# Patient Record
Sex: Male | Born: 1955 | Race: White | Hispanic: No | Smoking: Current every day smoker
Health system: Southern US, Community
[De-identification: ages and names within clinical notes are randomized; demographics above are authoritative.]

## PROBLEM LIST (undated history)

## (undated) DIAGNOSIS — F17200 Nicotine dependence, unspecified, uncomplicated: Secondary | ICD-10-CM

---

## 2014-04-30 ENCOUNTER — Emergency Department
Admission: EM | Admit: 2014-04-30 | Discharge: 2014-04-30 | Disposition: A | Discharge status: Home / Self Care | Payer: Self-pay | Source: Home / Self Care

## 2014-04-30 ENCOUNTER — Encounter: Payer: Self-pay | Admitting: Emergency Medicine

## 2014-04-30 DIAGNOSIS — K089 Disorder of teeth and supporting structures, unspecified: Secondary | ICD-10-CM

## 2014-04-30 DIAGNOSIS — K0889 Other specified disorders of teeth and supporting structures: Secondary | ICD-10-CM

## 2014-04-30 HISTORY — DX: Nicotine dependence, unspecified, uncomplicated: F17.200

## 2014-04-30 MED ORDER — ERYTHROMYCIN BASE 500 MG PO TBEC: 500.0000 mg | DELAYED_RELEASE_TABLET | Freq: Four times a day (QID) | ORAL | Status: DC

## 2014-04-30 NOTE — ED Notes (Addendum)
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 Has been taking ibuprofen 800mg  q 6 hours.

## 2014-04-30 NOTE — ED Provider Notes (Signed)
CSN: [id number]     Arrival date & time 04/30/14  1814 History   None    Chief Complaint  Patient presents with  . Dental Pain   (Consider location/radiation/quality/duration/timing/severity/associated sxs/prior Treatment) Patient is a [age] y.o. male presenting with tooth pain. The history is provided by the patient. No language interpreter was used.  Dental Pain Location:  Lower Lower teeth location:  19/LL 1st molar Quality:  Aching Severity:  Moderate Onset quality:  Gradual Timing:  Constant Progression:  Worsening Chronicity:  New Context: poor dentition   Relieved by:  Nothing Ineffective treatments:  NSAIDs Risk factors: alcohol problem     Past Medical History  Diagnosis Date  . Heavy smoker    History reviewed. No pertinent past surgical history. Family History  Problem Relation Age of Onset  . Hypertension Brother   . Diabetes Maternal Aunt    History  Substance Use Topics  . Smoking status: Current Every Day Smoker  . Smokeless tobacco: Not on file  . Alcohol Use: No    Review of Systems  HENT: Positive for dental problem.   All other systems reviewed and are negative.   Allergies  Review of patient's allergies indicates no known allergies.  Home Medications   Prior to Admission medications   Medication Sig Start Date End Date Taking? Authorizing Provider  erythromycin (ERY-TAB) 500 MG EC tablet Take 1 tablet (500 mg total) by mouth 4 (four) times daily. 04/30/14   Elson AreasLeslie K Roni Friberg, PA-C   BP 161/77 mmHg  Pulse 69  Temp(Src) 98.2 F (36.8 C) (Oral)  Resp 16  Ht 5' 6.5" (1.689 m)  Wt 160 lb (72.576 kg)  BMI 25.44 kg/m2  SpO2 98% Physical Exam  Constitutional: He is oriented to person, place, and time. He appears well-developed and well-nourished.  HENT:  Head: Normocephalic.  Poor dentition, severe decay,   Broken tooth left lower   Eyes: EOM are normal. Pupils are equal, round, and reactive to light.  Neck: Normal range of motion.   Cardiovascular: Normal rate.   Pulmonary/Chest: Effort normal.  Abdominal: He exhibits no distension.  Musculoskeletal: Normal range of motion.  Neurological: He is alert and oriented to person, place, and time.  Psychiatric: He has a normal mood and affect.  Nursing note and vitals reviewed.   ED Course  Procedures (including critical care time) Labs Review Labs Reviewed - No data to display  Imaging Review No results found.  Meds ordered this encounter  Medications  . erythromycin (ERY-TAB) 500 MG EC tablet    Sig: Take 1 tablet (500 mg total) by mouth 4 (four) times daily.    Dispense:  40 tablet    Refill:  0    Order Specific Question:  Supervising Provider    Answer:  MASSEY, DAVID [[id number]]   MDM   1. Tooth ache    Meds ordered this encounter  Medications  . erythromycin (ERY-TAB) 500 MG EC tablet    Sig: Take 1 tablet (500 mg total) by mouth 4 (four) times daily.    Dispense:  40 tablet    Refill:  0    Order Specific Question:  Supervising Provider    Answer:  Georgina PillionMASSEY, DAVID [[id number]]  AVS Referral to Dr Lucky CowboyKnox and Dr. Nance Pewivils    Alie Moudy K Jacquita Mulhearn, PA-C 04/30/14 1929

## 2014-04-30 NOTE — Discharge Instructions (Signed)

## 2018-04-10 ENCOUNTER — Emergency Department (HOSPITAL_COMMUNITY): Payer: PRIVATE HEALTH INSURANCE

## 2018-04-10 ENCOUNTER — Inpatient Hospital Stay (HOSPITAL_COMMUNITY)
Admission: EM | Admit: 2018-04-10 | Discharge: 2018-04-14 | DRG: 190 | Disposition: A | Discharge status: Home / Self Care | Payer: PRIVATE HEALTH INSURANCE | Attending: Internal Medicine | Admitting: Internal Medicine

## 2018-04-10 ENCOUNTER — Encounter (HOSPITAL_COMMUNITY): Payer: Self-pay | Admitting: Emergency Medicine

## 2018-04-10 ENCOUNTER — Other Ambulatory Visit: Payer: Self-pay

## 2018-04-10 DIAGNOSIS — F17200 Nicotine dependence, unspecified, uncomplicated: Secondary | ICD-10-CM | POA: Diagnosis present

## 2018-04-10 DIAGNOSIS — J9601 Acute respiratory failure with hypoxia: Secondary | ICD-10-CM | POA: Diagnosis not present

## 2018-04-10 DIAGNOSIS — R0602 Shortness of breath: Secondary | ICD-10-CM | POA: Diagnosis not present

## 2018-04-10 DIAGNOSIS — A419 Sepsis, unspecified organism: Secondary | ICD-10-CM | POA: Diagnosis not present

## 2018-04-10 DIAGNOSIS — E860 Dehydration: Secondary | ICD-10-CM | POA: Diagnosis present

## 2018-04-10 DIAGNOSIS — T380X5A Adverse effect of glucocorticoids and synthetic analogues, initial encounter: Secondary | ICD-10-CM | POA: Diagnosis present

## 2018-04-10 DIAGNOSIS — N179 Acute kidney failure, unspecified: Secondary | ICD-10-CM | POA: Diagnosis not present

## 2018-04-10 DIAGNOSIS — Z720 Tobacco use: Secondary | ICD-10-CM | POA: Diagnosis present

## 2018-04-10 DIAGNOSIS — D72829 Elevated white blood cell count, unspecified: Secondary | ICD-10-CM | POA: Diagnosis present

## 2018-04-10 DIAGNOSIS — J9602 Acute respiratory failure with hypercapnia: Secondary | ICD-10-CM | POA: Diagnosis not present

## 2018-04-10 DIAGNOSIS — E872 Acidosis: Secondary | ICD-10-CM | POA: Diagnosis present

## 2018-04-10 DIAGNOSIS — R739 Hyperglycemia, unspecified: Secondary | ICD-10-CM | POA: Diagnosis present

## 2018-04-10 DIAGNOSIS — J441 Chronic obstructive pulmonary disease with (acute) exacerbation: Secondary | ICD-10-CM | POA: Diagnosis present

## 2018-04-10 DIAGNOSIS — F419 Anxiety disorder, unspecified: Secondary | ICD-10-CM | POA: Diagnosis present

## 2018-04-10 DIAGNOSIS — R6510 Systemic inflammatory response syndrome (SIRS) of non-infectious origin without acute organ dysfunction: Secondary | ICD-10-CM | POA: Diagnosis present

## 2018-04-10 LAB — CBC WITH DIFFERENTIAL/PLATELET
Abs Immature Granulocytes: 0.1 10*3/uL — ABNORMAL HIGH (ref 0.00–0.07)
Basophils Absolute: 0 10*3/uL (ref 0.0–0.1)
Basophils Relative: 0 %
EOS ABS: 0 10*3/uL (ref 0.0–0.5)
EOS PCT: 0 %
HEMATOCRIT: 49.9 % (ref 39.0–52.0)
HEMOGLOBIN: 15.6 g/dL (ref 13.0–17.0)
Immature Granulocytes: 1 %
LYMPHS PCT: 10 %
Lymphs Abs: 1.5 10*3/uL (ref 0.7–4.0)
MCH: 28.5 pg (ref 26.0–34.0)
MCHC: 31.3 g/dL (ref 30.0–36.0)
MCV: 91.1 fL (ref 80.0–100.0)
MONOS PCT: 11 %
Monocytes Absolute: 1.6 10*3/uL — ABNORMAL HIGH (ref 0.1–1.0)
NRBC: 0 % (ref 0.0–0.2)
Neutro Abs: 11.6 10*3/uL — ABNORMAL HIGH (ref 1.7–7.7)
Neutrophils Relative %: 78 %
Platelets: 235 10*3/uL (ref 150–400)
RBC: 5.48 MIL/uL (ref 4.22–5.81)
RDW: 13 % (ref 11.5–15.5)
WBC: 14.8 10*3/uL — ABNORMAL HIGH (ref 4.0–10.5)

## 2018-04-10 LAB — I-STAT VENOUS BLOOD GAS, ED
Acid-Base Excess: 6 mmol/L — ABNORMAL HIGH (ref 0.0–2.0)
BICARBONATE: 33.6 mmol/L — AB (ref 20.0–28.0)
O2 Saturation: 97 %
PCO2 VEN: 60.4 mmHg — AB (ref 44.0–60.0)
PH VEN: 7.353 (ref 7.250–7.430)
TCO2: 35 mmol/L — ABNORMAL HIGH (ref 22–32)
pO2, Ven: 97 mmHg — ABNORMAL HIGH (ref 32.0–45.0)

## 2018-04-10 LAB — I-STAT CG4 LACTIC ACID, ED
Lactic Acid, Venous: 3.25 mmol/L (ref 0.5–1.9)
Lactic Acid, Venous: 5.37 mmol/L (ref 0.5–1.9)

## 2018-04-10 LAB — COMPREHENSIVE METABOLIC PANEL
ALT: 21 U/L (ref 0–44)
AST: 39 U/L (ref 15–41)
Albumin: 3.9 g/dL (ref 3.5–5.0)
Alkaline Phosphatase: 45 U/L (ref 38–126)
Anion gap: 12 (ref 5–15)
BUN: 18 mg/dL (ref 6–20)
CO2: 27 mmol/L (ref 22–32)
CREATININE: 1.29 mg/dL — AB (ref 0.61–1.24)
Calcium: 9 mg/dL (ref 8.9–10.3)
Chloride: 95 mmol/L — ABNORMAL LOW (ref 98–111)
GFR calc non Af Amer: 60 mL/min — ABNORMAL LOW (ref >60)
Glucose, Bld: 150 mg/dL — ABNORMAL HIGH (ref 70–99)
Potassium: 4.2 mmol/L (ref 3.5–5.1)
SODIUM: 134 mmol/L — AB (ref 135–145)
Total Bilirubin: 0.7 mg/dL (ref 0.3–1.2)
Total Protein: 7.4 g/dL (ref 6.5–8.1)

## 2018-04-10 LAB — I-STAT TROPONIN, ED: Troponin i, poc: 0 ng/mL (ref 0.00–0.08)

## 2018-04-10 LAB — PROCALCITONIN: PROCALCITONIN: 0.17 ng/mL

## 2018-04-10 LAB — D-DIMER, QUANTITATIVE (NOT AT ARMC): D DIMER QUANT: 0.82 ug{FEU}/mL — AB (ref 0.00–0.50)

## 2018-04-10 MED ORDER — LORAZEPAM 2 MG/ML IJ SOLN
0.5000 mg | Freq: Once | INTRAMUSCULAR | Status: AC
  Administered 2018-04-10: 0.5 mg via INTRAVENOUS
  Filled 2018-04-10: qty 1

## 2018-04-10 MED ORDER — AZITHROMYCIN 250 MG PO TABS
500.0000 mg | ORAL_TABLET | Freq: Every day | ORAL | Status: AC
  Administered 2018-04-10: 500 mg via ORAL
  Filled 2018-04-10: qty 2

## 2018-04-10 MED ORDER — SODIUM CHLORIDE 0.9 % IV SOLN
INTRAVENOUS | Status: DC
  Administered 2018-04-10: 23:00:00 via INTRAVENOUS

## 2018-04-10 MED ORDER — ACETAMINOPHEN 325 MG PO TABS: 650.0000 mg | ORAL_TABLET | Freq: Four times a day (QID) | ORAL | Status: DC | PRN

## 2018-04-10 MED ORDER — ONDANSETRON HCL 4 MG/2ML IJ SOLN: 4.0000 mg | Freq: Three times a day (TID) | INTRAMUSCULAR | Status: DC | PRN

## 2018-04-10 MED ORDER — ALBUTEROL (5 MG/ML) CONTINUOUS INHALATION SOLN
15.0000 mg | INHALATION_SOLUTION | RESPIRATORY_TRACT | Status: DC
  Administered 2018-04-10: 15 mg via RESPIRATORY_TRACT
  Filled 2018-04-10: qty 20

## 2018-04-10 MED ORDER — SODIUM CHLORIDE 0.9 % IV BOLUS
1500.0000 mL | Freq: Once | INTRAVENOUS | Status: AC
  Administered 2018-04-10: 1500 mL via INTRAVENOUS

## 2018-04-10 MED ORDER — AZITHROMYCIN 250 MG PO TABS
250.0000 mg | ORAL_TABLET | Freq: Every day | ORAL | Status: AC
  Administered 2018-04-11 – 2018-04-14 (×4): 250 mg via ORAL
  Filled 2018-04-10 (×4): qty 1

## 2018-04-10 MED ORDER — LEVALBUTEROL HCL 1.25 MG/0.5ML IN NEBU
1.2500 mg | INHALATION_SOLUTION | Freq: Three times a day (TID) | RESPIRATORY_TRACT | Status: DC
  Administered 2018-04-11 – 2018-04-14 (×10): 1.25 mg via RESPIRATORY_TRACT
  Filled 2018-04-10 (×10): qty 0.5

## 2018-04-10 MED ORDER — IPRATROPIUM BROMIDE 0.02 % IN SOLN
0.5000 mg | Freq: Three times a day (TID) | RESPIRATORY_TRACT | Status: DC
  Administered 2018-04-11 – 2018-04-14 (×10): 0.5 mg via RESPIRATORY_TRACT
  Filled 2018-04-10 (×10): qty 2.5

## 2018-04-10 MED ORDER — IPRATROPIUM BROMIDE 0.02 % IN SOLN
0.5000 mg | RESPIRATORY_TRACT | Status: DC
  Administered 2018-04-10: 0.5 mg via RESPIRATORY_TRACT
  Filled 2018-04-10: qty 2.5

## 2018-04-10 MED ORDER — DM-GUAIFENESIN ER 30-600 MG PO TB12
1.0000 | ORAL_TABLET | Freq: Two times a day (BID) | ORAL | Status: DC | PRN
Start: 2018-04-10 — End: 2018-04-13

## 2018-04-10 MED ORDER — NICOTINE 21 MG/24HR TD PT24
21.0000 mg | MEDICATED_PATCH | Freq: Every day | TRANSDERMAL | Status: DC
  Filled 2018-04-10 (×3): qty 1

## 2018-04-10 MED ORDER — METHYLPREDNISOLONE SODIUM SUCC 125 MG IJ SOLR
60.0000 mg | Freq: Three times a day (TID) | INTRAMUSCULAR | Status: DC
  Administered 2018-04-10 – 2018-04-12 (×5): 60 mg via INTRAVENOUS
  Filled 2018-04-10 (×5): qty 2

## 2018-04-10 MED ORDER — ZOLPIDEM TARTRATE 5 MG PO TABS: 5.0000 mg | ORAL_TABLET | Freq: Every evening | ORAL | Status: DC | PRN

## 2018-04-10 MED ORDER — SODIUM CHLORIDE 0.9 % IV BOLUS
1000.0000 mL | Freq: Once | INTRAVENOUS | Status: AC
  Administered 2018-04-10: 1000 mL via INTRAVENOUS

## 2018-04-10 MED ORDER — LEVALBUTEROL HCL 1.25 MG/0.5ML IN NEBU
1.2500 mg | INHALATION_SOLUTION | Freq: Four times a day (QID) | RESPIRATORY_TRACT | Status: DC
  Administered 2018-04-10: 1.25 mg via RESPIRATORY_TRACT
  Filled 2018-04-10: qty 0.5

## 2018-04-10 MED ORDER — ALBUTEROL (5 MG/ML) CONTINUOUS INHALATION SOLN
15.0000 mg/h | INHALATION_SOLUTION | RESPIRATORY_TRACT | Status: AC
  Administered 2018-04-10: 15 mg/h via RESPIRATORY_TRACT

## 2018-04-10 MED ORDER — ENOXAPARIN SODIUM 40 MG/0.4ML ~~LOC~~ SOLN
40.0000 mg | SUBCUTANEOUS | Status: DC
  Administered 2018-04-10 – 2018-04-13 (×4): 40 mg via SUBCUTANEOUS
  Filled 2018-04-10 (×4): qty 0.4

## 2018-04-10 NOTE — ED Notes (Signed)
I [place] ACid result of 3.25 reported to Dr. Clarene Duke
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
## 2018-04-10 NOTE — ED Notes (Signed)
I [place] Acid result of 5.37 reported to Glory Rosebush RN

## 2018-04-10 NOTE — H&P (Addendum)
History and Physical    Geoffrey RosenthalJeffrey Leonard QMV:[id number] DOB: Oct 25, 1955 DOA: 04/10/2018  Referring MD/NP/PA:   PCP: Patient, No Pcp Per   Patient coming from:  The patient is coming from home.  At baseline, pt is independent for most of ADL.        Chief Complaint: Shortness of breath and cough  HPI: Geoffrey RosenthalJeffrey Leonard is a 63 y.o. male with medical history significant of tobacco abuse, who presents with shortness of breath and cough.  Patient states that he has been having cough and shortness of breath for more than 3 days after exposed to disel type of chemical.  He has productive cough with brownish colored sputum production.  Denies any chest pain.  He can barely speak in full sentences in the ED.  Oxygen saturation 87% on room air, requiring 5 to 6 L mask oxygen.  He has chills, but no fever.  No nausea vomiting, diarrhea, abdominal pain, symptoms of UTI or unilateral weakness.  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  Denies tenderness in the calf areas.  Patient states that he has heavily smoked for more than 20 years, but stopped smoking 3 days ago.  ED Course: pt was found to have negative troponin, lactic acid 3.35, 5.37, AKI with creatinine 1.29, BUN 18, temperature 99.2, tachycardia, tachypnea.  Repeat ABG pH 7.35, CO2 60, O2 97. Chest x-ray negative for infiltration.  Patient is admitted to stepdown bed as inpatient.  Review of Systems:   General: no fevers, has chills, no body weight gain, has fatigue HEENT: no blurry vision, hearing changes or sore throat Respiratory: Has dyspnea, coughing, no wheezing CV: no chest pain, no palpitations GI: no nausea, vomiting, abdominal pain, diarrhea, constipation. GU: no dysuria, burning on urination, increased urinary frequency, hematuria  Ext: no leg edema Neuro: no unilateral weakness, numbness, or tingling, no vision change or hearing loss Skin: no rash, no skin tear. MSK: No muscle spasm, no deformity,  no limitation of range of movement in spin Heme: No easy bruising.  Travel history: Has a frequent long distant travel.  Allergy: No Known Allergies  Past Medical History:  Diagnosis Date  . Heavy smoker     History reviewed. No pertinent surgical history.  Social History:  reports that he has been smoking. He does not have any smokeless tobacco history on file. He reports that he does not drink alcohol. No history on file for drug.  Family History:  Family History  Problem Relation Age of Onset  . Hypertension Brother   . Diabetes Maternal Aunt      Prior to Admission medications   Medication Sig Start Date End Date Taking? Authorizing Provider  erythromycin (ERY-TAB) 500 MG EC tablet Take 1 tablet (500 mg total) by mouth 4 (four) times daily. 04/30/14   Elson AreasSofia, Leslie K, PA-C    Physical Exam: Vitals:   04/10/18 1803 04/10/18 1806 04/10/18 1833 04/10/18 1930  BP:  (!) 134/100 (!) 152/93 118/89  Pulse: (!) 128 (!) 129 (!) 133 (!) 126  Resp: (!) 21 (!) 22 (!) 25 (!) 24  Temp:      TempSrc:      SpO2: 96% 96% 91% 91%   General:  in acute respiratory distress HEENT:       Eyes: PERRL, EOMI, no scleral icterus.       ENT: No discharge from the ears and nose, no pharynx injection, no tonsillar enlargement.  Neck: No JVD, no bruit, no mass felt. Heme: No neck lymph node enlargement. Cardiac: S1/S2, RRR, No murmurs, No gallops or rubs. Respiratory: Severely decreased air movement bilaterally. GI: Soft, nondistended, nontender, no rebound pain, no organomegaly, BS present. GU: No hematuria Ext: No pitting leg edema bilaterally. 2+DP/PT pulse bilaterally. Musculoskeletal: No joint deformities, No joint redness or warmth, no limitation of ROM in spin. Skin: No rashes.  Neuro: Alert, oriented X3, cranial nerves II-XII grossly intact, moves all extremities normally.  Psych: Patient is not psychotic, no suicidal or hemocidal ideation.  Labs on Admission: I have personally  reviewed following labs and imaging studies  CBC: Recent Labs  Lab 04/10/18 1557  WBC 14.8*  NEUTROABS 11.6*  HGB 15.6  HCT 49.9  MCV 91.1  PLT 235   Basic Metabolic Panel: Recent Labs  Lab 04/10/18 1557  NA 134*  K 4.2  CL 95*  CO2 27  GLUCOSE 150*  BUN 18  CREATININE 1.29*  CALCIUM 9.0   GFR: CrCl cannot be calculated (Unknown ideal weight.). Liver Function Tests: Recent Labs  Lab 04/10/18 1557  AST 39  ALT 21  ALKPHOS 45  BILITOT 0.7  PROT 7.4  ALBUMIN 3.9   No results for input(s): LIPASE, AMYLASE in the last 168 hours. No results for input(s): AMMONIA in the last 168 hours. Coagulation Profile: No results for input(s): INR, PROTIME in the last 168 hours. Cardiac Enzymes: No results for input(s): CKTOTAL, CKMB, CKMBINDEX, TROPONINI in the last 168 hours. BNP (last 3 results) No results for input(s): PROBNP in the last 8760 hours. HbA1C: No results for input(s): HGBA1C in the last 72 hours. CBG: No results for input(s): GLUCAP in the last 168 hours. Lipid Profile: No results for input(s): CHOL, HDL, LDLCALC, TRIG, CHOLHDL, LDLDIRECT in the last 72 hours. Thyroid Function Tests: No results for input(s): TSH, T4TOTAL, FREET4, T3FREE, THYROIDAB in the last 72 hours. Anemia Panel: No results for input(s): VITAMINB12, FOLATE, FERRITIN, TIBC, IRON, RETICCTPCT in the last 72 hours. Urine analysis: No results found for: COLORURINE, APPEARANCEUR, LABSPEC, PHURINE, GLUCOSEU, HGBUR, BILIRUBINUR, KETONESUR, PROTEINUR, UROBILINOGEN, NITRITE, LEUKOCYTESUR Sepsis Labs: @LABRCNTIP (procalcitonin:4,lacticidven:4) )No results found for this or any previous visit (from the past 240 hour(s)).   Radiological Exams on Admission: Dg Chest Port 1 View  Result Date: 04/10/2018 CLINICAL DATA:  Shortness of breath. EXAM: PORTABLE CHEST 1 VIEW COMPARISON:  None. FINDINGS: The right costophrenic angle is excluded from the field of view. The heart size and mediastinal contours  are within normal limits. Normal pulmonary vascularity. Mild diffusely coarsened interstitial markings are likely smoking-related. No consolidation, pneumothorax, or large pleural effusion. No acute osseous abnormality. IMPRESSION: No active disease. Electronically Signed   By: Obie DredgeWilliam T Derry M.D.   On: 04/10/2018 18:01     EKG: Independently reviewed.  Sinus rhythm, tachycardia, QTC 442, low voltage, anteroseptal infarction pattern,  Assessment/Plan Principal Problem:   COPD exacerbation (HCC) Active Problems:   Acute respiratory failure with hypoxia and hypercapnia (HCC)   AKI (acute kidney injury) (HCC)   Tobacco abuse   Sepsis (HCC)   Acute respiratory failure with hypoxia and hypercapnia and sepsis due to COPD exacerbation: Patient has heavily smoked for more than 20 years, now has productive cough, shortness of breath, severely decreased air movement on auscultation, no infiltration on chest x-ray, clinically consistent with COPD exacerbation.  Patient may have undiagnosed COPD.  Will need outpatient PFT test.  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  Patient meets criteria for sepsis with leukocytosis, tachycardia and tachypnea.  Lactic acid is elevated up to 5.37.  -will admit patient to SDU as inpt -Nebulizers: scheduled Atrovent and prn Xopenex Nebs -Solu-Medrol 60 mg IV tid -Z pak -Mucinex for cough  -Incentive spirometry -Follow up blood culture x2, sputum culture, Flu pcr -Mask oxygen as needed to maintain O2 saturation 92% or greater. If deteriorates, will start BiPAP. -will get Procalcitonin and trend lactic acid levels per sepsis protocol. -IVF: 2.5L of NS bolus in ED, followed by 125 cc/h-->will give more NS bolus depending on lactic acid level. -check D-dimer. If positive, will get CTA to r/o PE and LE doppler to r/o DVT   AKI: Likely due to dehydration. - IVF as above - Follow up renal function by BMP -Avoid using  renal toxic medications, such as NSAIDs  Tobacco abuse: Patient states that he has heavily smoked for more than 20 years, but stopped smoking 3 days ago.  -Did counseling about importance of quitting smoking.  Encouraged the patient not to restart smoking. -Nicotine patch  Inpatient status:  # Patient requires inpatient status due to high intensity of service, high risk for further deterioration and high frequency of surveillance required.  I certify that at the point of admission it is my clinical judgment that the patient will require inpatient hospital care spanning beyond 2 midnights from the point of admission.  . This patient has long history of heavy smoking, may have undiagnosed COPD . The worrisome physical exam findings include severely diminished air movement bilaterally on auscultation. . The initial radiographic and laboratory data are worrisome because of AKI, sepsis, leukocytosis, CO2 retention . Current medical needs: please see my assessment and plan . Predictability of an adverse outcome (risk): Patient has severe COPD exacerbation.  Oxygen desaturation and new oxygen requirement. Also has sepsis. Lung auscultation showed severely diminished air movement bilaterally.  Patient is at high risk of deteriorating.  Will need to stay in hospital for at least 2 days.    DVT ppx: SQ Lovenox Code Status: Full code Family Communication: None at bed side.     Disposition Plan:  Anticipate discharge back to previous home environment Consults called:  none Admission status:   SDU/inpation       Date of Service 04/10/2018    Lorretta Harp [facility] Pager [phone]  If 7PM-7AM, please contact night-coverage [email or web address] Password [facility] 04/10/2018, 8:46 PM
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
## 2018-04-11 ENCOUNTER — Inpatient Hospital Stay (HOSPITAL_COMMUNITY): Payer: PRIVATE HEALTH INSURANCE

## 2018-04-11 ENCOUNTER — Encounter (HOSPITAL_COMMUNITY): Payer: Self-pay

## 2018-04-11 DIAGNOSIS — R7989 Other specified abnormal findings of blood chemistry: Secondary | ICD-10-CM

## 2018-04-11 DIAGNOSIS — J441 Chronic obstructive pulmonary disease with (acute) exacerbation: Principal | ICD-10-CM

## 2018-04-11 LAB — LACTIC ACID, PLASMA
LACTIC ACID, VENOUS: 6.9 mmol/L — AB (ref 0.5–1.9)
Lactic Acid, Venous: 7.2 mmol/L (ref 0.5–1.9)
Lactic Acid, Venous: 4.1 mmol/L (ref 0.5–1.9)
Lactic Acid, Venous: 1.7 mmol/L (ref 0.5–1.9)

## 2018-04-11 LAB — HIV ANTIBODY (ROUTINE TESTING W REFLEX): HIV Screen 4th Generation wRfx: NONREACTIVE

## 2018-04-11 LAB — MRSA PCR SCREENING: MRSA by PCR: NEGATIVE

## 2018-04-11 MED ORDER — ENSURE ENLIVE PO LIQD
237.0000 mL | Freq: Two times a day (BID) | ORAL | Status: DC
  Administered 2018-04-11 – 2018-04-13 (×3): 237 mL via ORAL

## 2018-04-11 MED ORDER — IOPAMIDOL (ISOVUE-370) INJECTION 76%
INTRAVENOUS | Status: AC
  Filled 2018-04-11: qty 100

## 2018-04-11 MED ORDER — SODIUM CHLORIDE 0.9 % IV BOLUS
1000.0000 mL | Freq: Once | INTRAVENOUS | Status: AC
  Administered 2018-04-11: 1000 mL via INTRAVENOUS

## 2018-04-11 MED ORDER — PANTOPRAZOLE SODIUM 40 MG PO TBEC
40.0000 mg | DELAYED_RELEASE_TABLET | Freq: Every day | ORAL | Status: DC
  Administered 2018-04-11 – 2018-04-12 (×3): 40 mg via ORAL
  Filled 2018-04-11 (×3): qty 1

## 2018-04-11 MED ORDER — IOPAMIDOL (ISOVUE-370) INJECTION 76%
100.0000 mL | Freq: Once | INTRAVENOUS | Status: AC | PRN
  Administered 2018-04-11: 75 mL via INTRAVENOUS

## 2018-04-11 MED ORDER — ALPRAZOLAM 0.5 MG PO TABS
0.5000 mg | ORAL_TABLET | Freq: Three times a day (TID) | ORAL | Status: DC | PRN
  Administered 2018-04-11 – 2018-04-13 (×4): 0.5 mg via ORAL
  Filled 2018-04-11 (×4): qty 1

## 2018-04-11 MED ORDER — ALUM & MAG HYDROXIDE-SIMETH 200-200-20 MG/5ML PO SUSP
30.0000 mL | Freq: Four times a day (QID) | ORAL | Status: DC | PRN
  Administered 2018-04-11: 30 mL via ORAL
  Filled 2018-04-11: qty 30

## 2018-04-11 NOTE — Progress Notes (Signed)
Bilateral lower extremity duplex has been completed.   Preliminary results in CV Proc.   Blanch Media 04/11/2018 9:26 AM

## 2018-04-11 NOTE — Progress Notes (Addendum)
CRITICAL VALUE ALERT  Critical Value: Lactic Acid 7.2  Date & Time Notied:  04/11/2018 & 00:07  Provider Notified: Dr. Clyde Lundborg  Orders Received/Actions taken: 1 Liter NS bolus and increase continuous fluids to 150 ml/hr

## 2018-04-11 NOTE — Progress Notes (Signed)
Nutrition Consult/Brief Note  RD consulted via Inpatient COPD Exacerbation Protocol.  Wt Readings from Last 15 Encounters:  04/10/18 70.6 kg  04/30/14 72.6 kg   Body mass index is 24.38 kg/m. Patient meets criteria for Normal based on current BMI.   Current diet order is Heart Healthy, patient is consuming approximately 100% of meals at this time.   Labs and medications reviewed. No nutrition interventions warranted at this time.   If nutrition issues arise, please consult RD.   Maureen Chatters, RD, LDN Pager #: [phone] After-Hours Pager #: [phone]

## 2018-04-11 NOTE — Progress Notes (Signed)
CRITICAL VALUE ALERT  Critical Value:  Lactic Acid 6.9  Date & Time Notied:  04/10/2018 & 21:35  Provider Notified: Dr. Clyde Lundborg  Orders Received/Actions taken: 1.5 NS Bolus and NS continuous fluids at 125 ml/hr

## 2018-04-11 NOTE — Progress Notes (Signed)
CRITICAL VALUE ALERT  Critical Value: Lactic Acid 4.1  Date & Time Notied:  04/11/2018 & 02:20  Provider Notified: Dr. Clyde Lundborg  Orders Received/Actions taken: No additional orders, continue to monitor pt

## 2018-04-11 NOTE — Progress Notes (Signed)
PROGRESS NOTE    Geoffrey Leonard  UJW:[id number] DOB: 03-Sep-1955 DOA: 04/10/2018 PCP: Patient, No Pcp Per  Brief Narrative: 63 year old male with history of heavy tobacco abuse, 50-60-pack-year smoking history presented to the ER with cough congestion shortness of breath and wheezing. -In the ED they did a CT angiogram which was concerning for bronchitis  Assessment & Plan:   Acute hypoxic respiratory failure -Due to COPD exacerbation/bronchitis -Continue IV steroids today, azithromycin and duo nebs -Wean O2 as tolerated -Incentive spirometer  Elevated lactic acid -Likely secondary to respiratory distress -No evidence of sepsis at this time, cut back IV fluids  Mild AKI -Monitor with hydration  Tobacco abuse -Counseled, patient declines nicotine patch  DVT prophylaxis: Subcutaneous Lovenox Code Status: Full code Family Communication: No family at bedside Disposition Plan: Home pending clinical improvement likely 1 to 2 days  Consultants:      Procedures:   Antimicrobials:    Subjective: -Feels better today, breathing improving, not back to baseline yet, still coughing and wheezing some  Objective: Vitals:   04/11/18 0457 04/11/18 0748 04/11/18 0758 04/11/18 0806  BP: 121/70 122/71 122/71   Pulse:   97 94  Resp:  17 18 18   Temp:  98 F (36.7 C)    TempSrc:  Oral    SpO2:  90% 91% 97%  Weight:      Height:        Intake/Output Summary (Last 24 hours) at 04/11/2018 1041 Last data filed at 04/11/2018 0400 Gross per 24 hour  Intake 2456.14 ml  Output 450 ml  Net 2006.14 ml   Filed Weights   04/10/18 2149  Weight: 70.6 kg    Examination:  General exam: Appears calm and comfortable, no distress Respiratory system: Poor air movement bilaterally, scattered expiratory wheezes Cardiovascular system: S1 & S2 heard, RRR.  Gastrointestinal system: Abdomen is nondistended, soft and nontender.Normal bowel sounds heard. Central nervous system: Alert and  oriented. No focal neurological deficits. Extremities: No edema Skin: No rashes, lesions or ulcers Psychiatry: Judgement and insight appear normal. Mood & affect appropriate.     Data Reviewed:   CBC: Recent Labs  Lab 04/10/18 1557  WBC 14.8*  NEUTROABS 11.6*  HGB 15.6  HCT 49.9  MCV 91.1  PLT 235   Basic Metabolic Panel: Recent Labs  Lab 04/10/18 1557  NA 134*  K 4.2  CL 95*  CO2 27  GLUCOSE 150*  BUN 18  CREATININE 1.29*  CALCIUM 9.0   GFR: Estimated Creatinine Clearance: 56.9 mL/min (A) (by C-G formula based on SCr of 1.29 mg/dL (H)). Liver Function Tests: Recent Labs  Lab 04/10/18 1557  AST 39  ALT 21  ALKPHOS 45  BILITOT 0.7  PROT 7.4  ALBUMIN 3.9   No results for input(s): LIPASE, AMYLASE in the last 168 hours. No results for input(s): AMMONIA in the last 168 hours. Coagulation Profile: No results for input(s): INR, PROTIME in the last 168 hours. Cardiac Enzymes: No results for input(s): CKTOTAL, CKMB, CKMBINDEX, TROPONINI in the last 168 hours. BNP (last 3 results) No results for input(s): PROBNP in the last 8760 hours. HbA1C: No results for input(s): HGBA1C in the last 72 hours. CBG: No results for input(s): GLUCAP in the last 168 hours. Lipid Profile: No results for input(s): CHOL, HDL, LDLCALC, TRIG, CHOLHDL, LDLDIRECT in the last 72 hours. Thyroid Function Tests: No results for input(s): TSH, T4TOTAL, FREET4, T3FREE, THYROIDAB in the last 72 hours. Anemia Panel: No results for input(s): VITAMINB12, FOLATE, FERRITIN, TIBC,  IRON, RETICCTPCT in the last 72 hours. Urine analysis: No results found for: COLORURINE, APPEARANCEUR, LABSPEC, PHURINE, GLUCOSEU, HGBUR, BILIRUBINUR, KETONESUR, PROTEINUR, UROBILINOGEN, NITRITE, LEUKOCYTESUR Sepsis Labs: @LABRCNTIP (procalcitonin:4,lacticidven:4)  ) Recent Results (from the past 240 hour(s))  Culture, blood (x 2)     Status: None (Preliminary result)   Collection Time: 04/10/18  8:32 PM  Result  Value Ref Range Status   Specimen Description BLOOD RIGHT ANTECUBITAL  Final   Special Requests   Final    BOTTLES DRAWN AEROBIC AND ANAEROBIC Blood Culture adequate volume   Culture   Final    NO GROWTH < 12 HOURS Performed at [facility], [address] [place], [place] [address]    Report Status PENDING  Incomplete  Culture, blood (x 2)     Status: None (Preliminary result)   Collection Time: 04/10/18  8:40 PM  Result Value Ref Range Status   Specimen Description BLOOD BLOOD LEFT HAND  Final   Special Requests   Final    BOTTLES DRAWN AEROBIC AND ANAEROBIC Blood Culture adequate volume   Culture   Final    NO GROWTH < 12 HOURS Performed at [facility], [address] [place], [place] [address]    Report Status PENDING  Incomplete  MRSA PCR Screening     Status: None   Collection Time: 04/10/18 10:11 PM  Result Value Ref Range Status   MRSA by PCR NEGATIVE NEGATIVE Final    Comment:        The GeneXpert MRSA Assay (FDA approved for NASAL specimens only), is one component of a comprehensive MRSA colonization surveillance program. It is not intended to diagnose MRSA infection nor to guide or monitor treatment for MRSA infections. Performed at [facility], [address] , [place] [address]          Radiology Studies: Ct Angio Chest Pe [address]  [id number]: Cardiovascular: Satisfactory opacification of the pulmonary arteries to the segmental level. No evidence  of pulmonary embolism. Normal heart size. No pericardial effusion. Mild aortic and moderate coronary artery calcific atherosclerosis. Mediastinum/Nodes: No enlarged mediastinal, hilar, or axillary lymph nodes. Thyroid gland, trachea, and esophagus demonstrate no significant findings. Lungs/Pleura: Peribronchial thickening and scattered mucous plugging. Scattered clusters of tiny nodules in bronchovascular distribution. No consolidation, effusion, or pneumothorax. Moderate centrilobular emphysema. Upper Abdomen: 20 mm left adrenal nodule measuring 2 HU compatible with adenoma. Musculoskeletal: No chest wall abnormality. No acute or significant osseous findings. Review of the MIP images confirms the above findings. IMPRESSION: 1. No pulmonary embolus identified. 2. Peribronchial thickening and scattered clustered nodules in bronchovascular distribution compatible with mild bronchitis/bronchiolitis. No consolidation. 3. Aortic Atherosclerosis (ICD10-I70.0) and Emphysema (ICD10-J43.9). 4. Moderate coronary artery calcific atherosclerosis. 5. Left adrenal adenoma. Electronically Signed   By: Lance  Furusawa-Stratton M.D.   On: 04/11/2018 03:03   Dg Chest Port 1 View  Result Date: 04/10/2018 CLINICAL DATA:  Shortness of breath. EXAM: PORTABLE CHEST 1 VIEW COMPARISON:  None. FINDINGS: The right costophrenic angle is excluded from the field of view. The heart size and mediastinal contours are  within normal limits. Normal pulmonary vascularity. Mild diffusely coarsened interstitial markings are likely smoking-related. No consolidation, pneumothorax, or large pleural effusion. No acute osseous abnormality. IMPRESSION: No active disease. Electronically Signed   By: Obie Dredge M.D.   On: 04/10/2018 18:01   Vas [place] Lower Extremity Venous (dvt)  Result Date: 04/11/2018  Lower Venous Study Indications: Positive d dimer.  Performing Technologist: Blanch Media RVS  Examination Guidelines: A complete evaluation includes  B-mode imaging, spectral Doppler, color Doppler, and power Doppler as needed of all accessible portions of each vessel. Bilateral testing is considered an integral part of a complete examination. Limited examinations for reoccurring indications may be performed as noted.  Right Venous Findings: +---------+---------------+---------+-----------+----------+-------+          CompressibilityPhasicitySpontaneityPropertiesSummary +---------+---------------+---------+-----------+----------+-------+ CFV      Full           Yes      Yes                          +---------+---------------+---------+-----------+----------+-------+ SFJ      Full                                                 +---------+---------------+---------+-----------+----------+-------+ FV Prox  Full                                                 +---------+---------------+---------+-----------+----------+-------+ FV Mid   Full                                                 +---------+---------------+---------+-----------+----------+-------+ FV DistalFull                                                 +---------+---------------+---------+-----------+----------+-------+ PFV      Full                                                 +---------+---------------+---------+-----------+----------+-------+ POP      Full           Yes      Yes                          +---------+---------------+---------+-----------+----------+-------+ PTV      Full                                                 +---------+---------------+---------+-----------+----------+-------+ PERO     Full                                                 +---------+---------------+---------+-----------+----------+-------+  Left Venous Findings: +---------+---------------+---------+-----------+----------+-------+          CompressibilityPhasicitySpontaneityPropertiesSummary  +---------+---------------+---------+-----------+----------+-------+ CFV      Full           Yes      Yes                          +---------+---------------+---------+-----------+----------+-------+ SFJ      Full                                                 +---------+---------------+---------+-----------+----------+-------+ FV Prox  Full                                                 +---------+---------------+---------+-----------+----------+-------+ FV Mid   Full                                                 +---------+---------------+---------+-----------+----------+-------+ FV DistalFull                                                 +---------+---------------+---------+-----------+----------+-------+ PFV      Full                                                 +---------+---------------+---------+-----------+----------+-------+ POP      Full           Yes      Yes                          +---------+---------------+---------+-----------+----------+-------+ PTV      Full                                                 +---------+---------------+---------+-----------+----------+-------+ PERO     Full                                                 +---------+---------------+---------+-----------+----------+-------+    Summary: Right: There is no evidence of deep vein thrombosis in the lower extremity. No cystic structure found in the popliteal fossa. Left: There is no evidence of deep vein thrombosis in the lower extremity. No cystic structure found in the popliteal fossa.  *See table(s) above for measurements and observations.    Preliminary         Scheduled Meds: . azithromycin  250 mg Oral Daily  . enoxaparin (LOVENOX) injection  40 mg Subcutaneous Q24H  . feeding supplement (ENSURE ENLIVE)  237 mL Oral BID BM  . iopamidol      .  ipratropium  0.5 mg Nebulization TID  . levalbuterol  1.25 mg Nebulization TID  .  methylPREDNISolone (SOLU-MEDROL) injection  60 mg Intravenous TID  . nicotine  21 mg Transdermal Daily  . pantoprazole  40 mg Oral Q1200   Continuous Infusions: . albuterol       LOS: 1 day    Time spent:    Zannie Cove, MD [facility] Page via [email or web address], password [email or web address] After 7PM please contact night-coverage  04/11/2018, 10:41 AM

## 2018-04-11 NOTE — Progress Notes (Signed)
This note also relates to the following rows which could not be included: SpO2 - Cannot attach notes to unvalidated device data  Patient had removed oxygen when RT entered room to give treatment and had sat of 91%.  Patient placed back on venturi mask at 5L/35% per sat goal of greater than 92%. Vitals are stable. RT will continue to monitor.

## 2018-04-12 LAB — CBC
HCT: 44.8 % (ref 39.0–52.0)
Hemoglobin: 13.9 g/dL (ref 13.0–17.0)
MCH: 28.7 pg (ref 26.0–34.0)
MCHC: 31 g/dL (ref 30.0–36.0)
MCV: 92.6 fL (ref 80.0–100.0)
Platelets: 223 10*3/uL (ref 150–400)
RBC: 4.84 MIL/uL (ref 4.22–5.81)
RDW: 13.3 % (ref 11.5–15.5)
WBC: 33.1 10*3/uL — AB (ref 4.0–10.5)
nRBC: 0 % (ref 0.0–0.2)

## 2018-04-12 LAB — BASIC METABOLIC PANEL
Anion gap: 8 (ref 5–15)
BUN: 18 mg/dL (ref 6–20)
CO2: 34 mmol/L — ABNORMAL HIGH (ref 22–32)
CREATININE: 0.99 mg/dL (ref 0.61–1.24)
Calcium: 9.1 mg/dL (ref 8.9–10.3)
Chloride: 98 mmol/L (ref 98–111)
GFR calc Af Amer: >60 mL/min (ref >60)
GFR calc non Af Amer: >60 mL/min (ref >60)
Glucose, Bld: 154 mg/dL — ABNORMAL HIGH (ref 70–99)
Potassium: 4.5 mmol/L (ref 3.5–5.1)
Sodium: 140 mmol/L (ref 135–145)

## 2018-04-12 LAB — INFLUENZA PANEL BY PCR (TYPE A & B)
Influenza A By PCR: NEGATIVE
Influenza B By PCR: NEGATIVE

## 2018-04-12 MED ORDER — METHYLPREDNISOLONE SODIUM SUCC 125 MG IJ SOLR
60.0000 mg | Freq: Two times a day (BID) | INTRAMUSCULAR | Status: DC
  Administered 2018-04-12 – 2018-04-13 (×2): 60 mg via INTRAVENOUS
  Filled 2018-04-12 (×2): qty 2

## 2018-04-12 NOTE — Plan of Care (Signed)

## 2018-04-12 NOTE — Progress Notes (Signed)
Geoffrey Leonard  WCB:[id number] DOB: November 04, 1955 DOA: 04/10/2018 PCP: Patient, No Pcp Per  Brief Narrative: 63 year old male with history of heavy tobacco abuse, 50-60-pack-year smoking history presented to the ER with cough congestion shortness of breath and wheezing. -In the ED they did a CT angiogram which was concerning for bronchitis  Assessment & Plan:   Acute hypoxic respiratory failure -Due to COPD exacerbation/bronchitis -continue IV steroids today improving however still wheezing and hypoxic  -Continue azithromycin and duo nebs -Wean O2 as tolerated -Incentive spirometer  Elevated lactic acid -Likely secondary to respiratory distress -no evidence of sepsis, afebrile, nontoxic -Influenza PCR is still pending, ordered yesterday a.m.  Leukocytosis -Severe, acute, likely secondary to IV steroids, stress demargination -will repeat in a.m., follow-up influenza PCR  Mild AKI -Resolved  Tobacco abuse -Counseled, patient declines nicotine patch  Hyperglycemia -Secondary to steroids, sliding scale insulin  DVT prophylaxis: Subcutaneous Lovenox Code Status: Full code Family Communication: No family at bedside Disposition Plan: Home pending clinical improvement likely 1 to 2 days  Consultants:      Procedures:   Antimicrobials:    Subjective: -Little better from yesterday, continues to have wheezing and dyspnea  Objective: Vitals:   04/12/18 0041 04/12/18 0343 04/12/18 0758 04/12/18 1233  BP: (!) 148/79 116/65 (!) 153/86 119/68  Pulse: (!) 103 77 98 88  Resp: (!) 26 18 (!) 22 18  Temp: 97.6 F (36.4 C) 97.6 F (36.4 C) 97.8 F (36.6 C) (!) 97.5 F (36.4 C)  TempSrc:  Axillary Oral Oral  SpO2: 96% (!) 89% 96% 99%  Weight:      Height:        Intake/Output Summary (Last 24 hours) at 04/12/2018 1258 Last data filed at 04/12/2018 1104 Gross per 24 hour  Intake 600 ml  Output 1100 ml  Net -500 ml   Filed Weights   04/10/18 2149    Weight: 70.6 kg    Examination:  Gen: Awake, Alert, Oriented X 3, uncomfortable appearing, HEENT: PERRLA, Neck supple, no JVD Lungs: Poor air movement bilaterally, expiratory wheezes noted CVS: S1-S2/regular rate rhythm Abd: soft, Non tender, non distended, BS present Extremities: No edema Skin: no new rashes Psychiatry: Judgement and insight appear normal. Mood & affect appropriate.     Data Reviewed:   CBC: Recent Labs  Lab 04/10/18 1557 04/12/18 0906  WBC 14.8* 33.1*  NEUTROABS 11.6*  --   HGB 15.6 13.9  HCT 49.9 44.8  MCV 91.1 92.6  PLT 235 223   Basic Metabolic Panel: Recent Labs  Lab 04/10/18 1557 04/12/18 0906  NA 134* 140  K 4.2 4.5  CL 95* 98  CO2 27 34*  GLUCOSE 150* 154*  BUN 18 18  CREATININE 1.29* 0.99  CALCIUM 9.0 9.1   GFR: Estimated Creatinine Clearance: 74.2 mL/min (by C-G formula based on SCr of 0.99 mg/dL). Liver Function Tests: Recent Labs  Lab 04/10/18 1557  AST 39  ALT 21  ALKPHOS 45  BILITOT 0.7  PROT 7.4  ALBUMIN 3.9   No results for input(s): LIPASE, AMYLASE in the last 168 hours. No results for input(s): AMMONIA in the last 168 hours. Coagulation Profile: No results for input(s): INR, PROTIME in the last 168 hours. Cardiac Enzymes: No results for input(s): CKTOTAL, CKMB, CKMBINDEX, TROPONINI in the last 168 hours. BNP (last 3 results) No results for input(s): PROBNP in the last 8760 hours. HbA1C: No results for input(s): HGBA1C in the last 72 hours. CBG: No results for  input(s): GLUCAP in the last 168 hours. Lipid Profile: No results for input(s): CHOL, HDL, LDLCALC, TRIG, CHOLHDL, LDLDIRECT in the last 72 hours. Thyroid Function Tests: No results for input(s): TSH, T4TOTAL, FREET4, T3FREE, THYROIDAB in the last 72 hours. Anemia Panel: No results for input(s): VITAMINB12, FOLATE, FERRITIN, TIBC, IRON, RETICCTPCT in the last 72 hours. Urine analysis: No results found for: COLORURINE, APPEARANCEUR, LABSPEC, PHURINE,  GLUCOSEU, HGBUR, BILIRUBINUR, KETONESUR, PROTEINUR, UROBILINOGEN, NITRITE, LEUKOCYTESUR Sepsis Labs: @LABRCNTIP (procalcitonin:4,lacticidven:4)  ) Recent Results (from the past 240 hour(s))  Culture, blood (x 2)     Status: None (Preliminary result)   Collection Time: 04/10/18  8:32 PM  Result Value Ref Range Status   Specimen Description BLOOD RIGHT ANTECUBITAL  Final   Special Requests   Final    BOTTLES DRAWN AEROBIC AND ANAEROBIC Blood Culture adequate volume   Culture   Final    NO GROWTH < 12 HOURS Performed at [facility], [address] [place], [place] [address]    Report Status PENDING  Incomplete  Culture, blood (x 2)     Status: None (Preliminary result)   Collection Time: 04/10/18  8:40 PM  Result Value Ref Range Status   Specimen Description BLOOD BLOOD LEFT HAND  Final   Special Requests   Final    BOTTLES DRAWN AEROBIC AND ANAEROBIC Blood Culture adequate volume   Culture   Final    NO GROWTH < 12 HOURS Performed at [facility], [address] [place], [place] [address]    Report Status PENDING  Incomplete  MRSA PCR Screening     Status: None   Collection Time: 04/10/18 10:11 PM  Result Value Ref Range Status   MRSA by PCR NEGATIVE NEGATIVE Final    Comment:        The GeneXpert MRSA Assay (FDA approved for NASAL specimens only), is one component of a comprehensive MRSA colonization surveillance program. It is not intended to diagnose MRSA infection nor to guide or monitor treatment for MRSA infections. Performed at [facility], [address] [place], [place] [address]          Radiology Studies: Ct Angio Chest Pe W Or Wo Contrast  Result Date: 04/11/2018 CLINICAL DATA:  63 y/o M; PE suspected, intermediate prob, positive D-dimer. EXAM: CT ANGIOGRAPHY CHEST WITH CONTRAST TECHNIQUE: Multidetector CT imaging of the chest was performed using the standard protocol during bolus administration of intravenous contrast. Multiplanar  CT image reconstructions and MIPs were obtained to evaluate the vascular anatomy. CONTRAST:  75mL ISOVUE-370 IOPAMIDOL (ISOVUE-370) INJECTION 76% COMPARISON:  04/10/2017 chest radiograph FINDINGS: Cardiovascular: Satisfactory opacification of the pulmonary arteries to the segmental level. No evidence of pulmonary embolism. Normal heart size. No pericardial effusion. Mild aortic and moderate coronary artery calcific atherosclerosis. Mediastinum/Nodes: No enlarged mediastinal, hilar, or axillary lymph nodes. Thyroid gland, trachea, and esophagus demonstrate no significant findings. Lungs/Pleura: Peribronchial thickening and scattered mucous plugging. Scattered clusters of tiny nodules in bronchovascular distribution. No consolidation, effusion, or pneumothorax. Moderate centrilobular emphysema. Upper Abdomen: 20 mm left adrenal nodule measuring 2 HU compatible with adenoma. Musculoskeletal: No chest wall abnormality. No acute or significant osseous findings. Review of the MIP images confirms the above findings. IMPRESSION: 1. No pulmonary embolus identified. 2. Peribronchial thickening and scattered clustered nodules in bronchovascular distribution compatible with mild bronchitis/bronchiolitis. No consolidation. 3. Aortic Atherosclerosis (ICD10-I70.0) and Emphysema (ICD10-J43.9). 4. Moderate coronary artery calcific atherosclerosis. 5. Left adrenal adenoma. Electronically Signed   By: Micah NoelLance  Furusawa-Stratton M.D.   On: 04/11/2018 03:03   Dg Chest Port 1 View  Result Date: 04/10/2018 CLINICAL DATA:  Shortness of breath. EXAM: PORTABLE CHEST 1 VIEW COMPARISON:  None. FINDINGS: The right costophrenic angle is excluded from the field of view. The heart size and mediastinal contours are within normal limits. Normal pulmonary vascularity. Mild diffusely coarsened interstitial markings are likely smoking-related. No consolidation, pneumothorax, or large pleural effusion. No acute osseous abnormality. IMPRESSION: No  active disease. Electronically Signed   By: Obie Dredge M.D.   On: 04/10/2018 18:01   Vas [place] Lower Extremity Venous (dvt)  Result Date: 04/11/2018  Lower Venous Study Indications: Positive d dimer.  Performing Technologist: Blanch Media RVS  Examination Guidelines: A complete evaluation includes B-mode imaging, spectral Doppler, color Doppler, and power Doppler as needed of all accessible portions of each vessel. Bilateral testing is considered an integral part of a complete examination. Limited examinations for reoccurring indications may be performed as noted.  Right Venous Findings: +---------+---------------+---------+-----------+----------+-------+          CompressibilityPhasicitySpontaneityPropertiesSummary +---------+---------------+---------+-----------+----------+-------+ CFV      Full           Yes      Yes                          +---------+---------------+---------+-----------+----------+-------+ SFJ      Full                                                 +---------+---------------+---------+-----------+----------+-------+ FV Prox  Full                                                 +---------+---------------+---------+-----------+----------+-------+ FV Mid   Full                                                 +---------+---------------+---------+-----------+----------+-------+ FV DistalFull                                                 +---------+---------------+---------+-----------+----------+-------+ PFV      Full                                                 +---------+---------------+---------+-----------+----------+-------+ POP      Full           Yes      Yes                          +---------+---------------+---------+-----------+----------+-------+ PTV      Full                                                 +---------+---------------+---------+-----------+----------+-------+  PERO     Full                                                  +---------+---------------+---------+-----------+----------+-------+  Left Venous Findings: +---------+---------------+---------+-----------+----------+-------+          CompressibilityPhasicitySpontaneityPropertiesSummary +---------+---------------+---------+-----------+----------+-------+ CFV      Full           Yes      Yes                          +---------+---------------+---------+-----------+----------+-------+ SFJ      Full                                                 +---------+---------------+---------+-----------+----------+-------+ FV Prox  Full                                                 +---------+---------------+---------+-----------+----------+-------+ FV Mid   Full                                                 +---------+---------------+---------+-----------+----------+-------+ FV DistalFull                                                 +---------+---------------+---------+-----------+----------+-------+ PFV      Full                                                 +---------+---------------+---------+-----------+----------+-------+ POP      Full           Yes      Yes                          +---------+---------------+---------+-----------+----------+-------+ PTV      Full                                                 +---------+---------------+---------+-----------+----------+-------+ PERO     Full                                                 +---------+---------------+---------+-----------+----------+-------+    Summary: Right: There is no evidence of deep vein thrombosis in the lower extremity. No cystic structure found in the popliteal fossa. Left: There is no evidence of deep vein thrombosis in the lower extremity. No cystic structure found in the popliteal fossa.  *  See table(s) above for measurements and observations. Electronically signed by Waverly Ferrarihristopher Dickson MD on  04/11/2018 at 10:42:14 AM.    Final         Scheduled Meds: . azithromycin  250 mg Oral Daily  . enoxaparin (LOVENOX) injection  40 mg Subcutaneous Q24H  . feeding supplement (ENSURE ENLIVE)  237 mL Oral BID BM  . ipratropium  0.5 mg Nebulization TID  . levalbuterol  1.25 mg Nebulization TID  . methylPREDNISolone (SOLU-MEDROL) injection  60 mg Intravenous TID  . nicotine  21 mg Transdermal Daily  . pantoprazole  40 mg Oral Q1200   Continuous Infusions: . albuterol       LOS: 2 days    Time spent: 25min    Zannie CovePreetha Morghan Kester, MD [facility] Page via [email or web address], password [email or web address] After 7PM please contact night-coverage  04/12/2018, 12:58 PM

## 2018-04-12 NOTE — Care Management Note (Signed)
Case Management Note  Patient Details  Name: Dvaughn Gwyn MRN: [id number] Date of Birth: 1955/08/27  Subjective/Objective:    From home, pta indep, acute hypoxic resp failure, elevated lactic acid, leukocytosis, mild aki, tobacco abuse, hyperglycemia.  Patient for likely dc tomorrow 1/15.  He has Insurance but no PCP, NCM shceduled apt for him at the Primary Care at [facility] on 1/29 at 2:10.  NCM provided patient with brochure for Primary care at [facility] and [facility] clinic.                   Action/Plan: DC home when ready.  Expected Discharge Date:                  Expected Discharge Plan:  Home/Self Care  In-House Referral:     Discharge planning Services  CM Consult, Follow-up appt scheduled  Post Acute Care Choice:    Choice offered to:     DME Arranged:    DME Agency:     HH Arranged:    HH Agency:     Status of Service:  Completed, signed off  If discussed at [employer] of Stay Meetings, dates discussed:    Additional Comments:  Leone Haven, RN 04/12/2018, 4:03 PM

## 2018-04-13 LAB — CBC
HEMATOCRIT: 39.7 % (ref 39.0–52.0)
Hemoglobin: 12 g/dL — ABNORMAL LOW (ref 13.0–17.0)
MCH: 28.1 pg (ref 26.0–34.0)
MCHC: 30.2 g/dL (ref 30.0–36.0)
MCV: 93 fL (ref 80.0–100.0)
Platelets: 203 10*3/uL (ref 150–400)
RBC: 4.27 MIL/uL (ref 4.22–5.81)
RDW: 13.2 % (ref 11.5–15.5)
WBC: 28.5 10*3/uL — ABNORMAL HIGH (ref 4.0–10.5)
nRBC: 0 % (ref 0.0–0.2)

## 2018-04-13 LAB — BASIC METABOLIC PANEL
Anion gap: 7 (ref 5–15)
BUN: 19 mg/dL (ref 6–20)
CO2: 38 mmol/L — ABNORMAL HIGH (ref 22–32)
Calcium: 9 mg/dL (ref 8.9–10.3)
Chloride: 96 mmol/L — ABNORMAL LOW (ref 98–111)
Creatinine, Ser: 0.9 mg/dL (ref 0.61–1.24)
GFR calc Af Amer: >60 mL/min (ref >60)
GFR calc non Af Amer: >60 mL/min (ref >60)
Glucose, Bld: 177 mg/dL — ABNORMAL HIGH (ref 70–99)
POTASSIUM: 5 mmol/L (ref 3.5–5.1)
Sodium: 141 mmol/L (ref 135–145)

## 2018-04-13 MED ORDER — DM-GUAIFENESIN ER 30-600 MG PO TB12: 1.0000 | ORAL_TABLET | Freq: Two times a day (BID) | ORAL | 0 refills | Status: DC

## 2018-04-13 MED ORDER — PANTOPRAZOLE SODIUM 40 MG PO TBEC: 40.0000 mg | DELAYED_RELEASE_TABLET | Freq: Every day | ORAL | 0 refills | Status: AC

## 2018-04-13 MED ORDER — PREDNISONE 20 MG PO TABS
50.0000 mg | ORAL_TABLET | Freq: Every day | ORAL | Status: DC
  Administered 2018-04-14: 50 mg via ORAL
  Filled 2018-04-13: qty 2

## 2018-04-13 MED ORDER — NICOTINE 21 MG/24HR TD PT24: 21.0000 mg | MEDICATED_PATCH | Freq: Every day | TRANSDERMAL | 0 refills | Status: AC

## 2018-04-13 MED ORDER — AZITHROMYCIN 250 MG PO TABS: 250.0000 mg | ORAL_TABLET | Freq: Every day | ORAL | 0 refills | Status: AC

## 2018-04-13 MED ORDER — DM-GUAIFENESIN ER 30-600 MG PO TB12
1.0000 | ORAL_TABLET | Freq: Two times a day (BID) | ORAL | Status: DC
  Administered 2018-04-13 – 2018-04-14 (×3): 1 via ORAL
  Filled 2018-04-13 (×3): qty 1

## 2018-04-13 MED ORDER — ENSURE ENLIVE PO LIQD: 237.0000 mL | Freq: Two times a day (BID) | ORAL | 12 refills | Status: AC

## 2018-04-13 MED ORDER — ALBUTEROL SULFATE HFA 108 (90 BASE) MCG/ACT IN AERS: 2.0000 | INHALATION_SPRAY | Freq: Four times a day (QID) | RESPIRATORY_TRACT | 2 refills | Status: DC | PRN

## 2018-04-13 MED ORDER — FLUTICASONE PROPIONATE 50 MCG/ACT NA SUSP: 1.0000 | Freq: Every day | NASAL | 0 refills | Status: AC

## 2018-04-13 MED ORDER — PREDNISONE 10 MG PO TABS: ORAL_TABLET | ORAL | 0 refills | Status: DC

## 2018-04-13 MED ORDER — ALPRAZOLAM 0.5 MG PO TABS: 0.5000 mg | ORAL_TABLET | Freq: Every evening | ORAL | 0 refills | Status: DC | PRN

## 2018-04-13 NOTE — Progress Notes (Signed)
RT NOTE: RT found patient on room air sating 88%. Patient has venturti mask next to him and says he wears it on and off and removed it to eat a snack. Vitals are stable. RT will continue to monitor.

## 2018-04-13 NOTE — Progress Notes (Signed)
PROGRESS NOTE    Myles RosenthalJeffrey Braver  RUE:[id number]:[id number] DOB: 1955-04-24 DOA: 04/10/2018 PCP: Patient, No Pcp Per  Brief Narrative: 63 year old male with history of heavy tobacco abuse, 50-60-pack-year smoking history presented to the ER with cough congestion shortness of breath and wheezing. -In the ED they did a CT angiogram which was concerning for bronchitis  Assessment & Plan:   Acute hypoxic respiratory failure -Due to COPD exacerbation/bronchitis -continue IV steroids today improving however still wheezing and hypoxic  -Continue azithromycin and duo nebs -Wean O2 as tolerated currently needing 6 L of oxygen on exertion. -Incentive spirometer  Elevated lactic acid -Likely secondary to respiratory distress -no evidence of sepsis, afebrile, nontoxic -Influenza PCR is still pending, ordered yesterday a.m.  Leukocytosis -Severe, acute, likely secondary to IV steroids, stress demargination  Mild AKI -Resolved  Tobacco abuse -Counseled, patient declines nicotine patch  Hyperglycemia -Secondary to steroids, sliding scale insulin  Anxiety. Patient was started on Xanax.  Would like to continue Xanax on discharge.  DVT prophylaxis: Subcutaneous Lovenox Code Status: Full code Family Communication: No family at bedside Disposition Plan: Home pending clinical improvement likely 1 to 2 days  Consultants:      Procedures:   Antimicrobials:    Subjective: Continues to report severe anxiety.  Also has shortness of breath and wheezing.  No nausea no vomiting.  Oral intake is improving.  Objective: Vitals:   04/13/18 0724 04/13/18 0736 04/13/18 1100 04/13/18 1431  BP: (!) 157/90 (!) 157/90 (!) 169/92   Pulse: (!) 108 (!) 103 (!) 103 (!) 107  Resp:  20 18 18   Temp: (!) 97.4 F (36.3 C)  (!) 97.5 F (36.4 C)   TempSrc: Oral  Oral   SpO2: (!) 83% 94% 94% (!) 88%  Weight:      Height:        Intake/Output Summary (Last 24 hours) at 04/13/2018 1935 Last data filed at  04/13/2018 1455 Gross per 24 hour  Intake -  Output 1950 ml  Net -1950 ml   Filed Weights   04/10/18 2149  Weight: 70.6 kg    Examination:  Gen: Awake, Alert, Oriented X 3, uncomfortable appearing, HEENT: PERRLA, Neck supple, no JVD Lungs: Poor air movement bilaterally, expiratory wheezes noted CVS: S1-S2/regular rate rhythm Abd: soft, Non tender, non distended, BS present Extremities: No edema Skin: no new rashes Psychiatry: Judgement and insight appear normal. Mood & affect appropriate.     Data Reviewed:   CBC: Recent Labs  Lab 04/10/18 1557 04/12/18 0906 04/13/18 0319  WBC 14.8* 33.1* 28.5*  NEUTROABS 11.6*  --   --   HGB 15.6 13.9 12.0*  HCT 49.9 44.8 39.7  MCV 91.1 92.6 93.0  PLT 235 223 203   Basic Metabolic Panel: Recent Labs  Lab 04/10/18 1557 04/12/18 0906 04/13/18 0319  NA 134* 140 141  K 4.2 4.5 5.0  CL 95* 98 96*  CO2 27 34* 38*  GLUCOSE 150* 154* 177*  BUN 18 18 19   CREATININE 1.29* 0.99 0.90  CALCIUM 9.0 9.1 9.0   GFR: Estimated Creatinine Clearance: 81.6 mL/min (by C-G formula based on SCr of 0.9 mg/dL). Liver Function Tests: Recent Labs  Lab 04/10/18 1557  AST 39  ALT 21  ALKPHOS 45  BILITOT 0.7  PROT 7.4  ALBUMIN 3.9   No results for input(s): LIPASE, AMYLASE in the last 168 hours. No results for input(s): AMMONIA in the last 168 hours. Coagulation Profile: No results for input(s): INR, PROTIME in the last 168  hours. Cardiac Enzymes: No results for input(s): CKTOTAL, CKMB, CKMBINDEX, TROPONINI in the last 168 hours. BNP (last 3 results) No results for input(s): PROBNP in the last 8760 hours. HbA1C: No results for input(s): HGBA1C in the last 72 hours. CBG: No results for input(s): GLUCAP in the last 168 hours. Lipid Profile: No results for input(s): CHOL, HDL, LDLCALC, TRIG, CHOLHDL, LDLDIRECT in the last 72 hours. Thyroid Function Tests: No results for input(s): TSH, T4TOTAL, FREET4, T3FREE, THYROIDAB in the last 72  hours. Anemia Panel: No results for input(s): VITAMINB12, FOLATE, FERRITIN, TIBC, IRON, RETICCTPCT in the last 72 hours. Urine analysis: No results found for: COLORURINE, APPEARANCEUR, LABSPEC, PHURINE, GLUCOSEU, HGBUR, BILIRUBINUR, KETONESUR, PROTEINUR, UROBILINOGEN, NITRITE, LEUKOCYTESUR Sepsis Labs: @LABRCNTIP (procalcitonin:4,lacticidven:4)  ) Recent Results (from the past 240 hour(s))  Culture, blood (x 2)     Status: None (Preliminary result)   Collection Time: 04/10/18  8:32 PM  Result Value Ref Range Status   Specimen Description BLOOD RIGHT ANTECUBITAL  Final   Special Requests   Final    BOTTLES DRAWN AEROBIC AND ANAEROBIC Blood Culture adequate volume   Culture   Final    NO GROWTH 3 DAYS Performed at [facility], [address] [place], [place] [address]    Report Status PENDING  Incomplete  Culture, blood (x 2)     Status: None (Preliminary result)   Collection Time: 04/10/18  8:40 PM  Result Value Ref Range Status   Specimen Description BLOOD BLOOD LEFT HAND  Final   Special Requests   Final    BOTTLES DRAWN AEROBIC AND ANAEROBIC Blood Culture adequate volume   Culture   Final    NO GROWTH 3 DAYS Performed at [facility], [address] [place], [place] [address]    Report Status PENDING  Incomplete  MRSA PCR Screening     Status: None   Collection Time: 04/10/18 10:11 PM  Result Value Ref Range Status   MRSA by PCR NEGATIVE NEGATIVE Final    Comment:        The GeneXpert MRSA Assay (FDA approved for NASAL specimens only), is one component of a comprehensive MRSA colonization surveillance program. It is not intended to diagnose MRSA infection nor to guide or monitor treatment for MRSA infections. Performed at [facility], [address] [place], [place] [address]          Radiology Studies: No results found.      Scheduled Meds: . azithromycin  250 mg Oral Daily  . dextromethorphan-guaiFENesin  1 tablet Oral BID  .  enoxaparin (LOVENOX) injection  40 mg Subcutaneous Q24H  . feeding supplement (ENSURE ENLIVE)  237 mL Oral BID BM  . ipratropium  0.5 mg Nebulization TID  . levalbuterol  1.25 mg Nebulization TID  . nicotine  21 mg Transdermal Daily  . pantoprazole  40 mg Oral Q1200  . [START ON 04/14/2018] predniSONE  50 mg Oral Q breakfast   Continuous Infusions: . albuterol       LOS: 3 days    Time spent: 35min  Author:  Lynden OxfordPranav Patel, MD [facility] 04/13/2018  If 7PM-7AM, please contact night-coverage To reach On-call, see [email or web address]    04/13/2018, 7:35 PM

## 2018-04-13 NOTE — Progress Notes (Signed)
NCM awaiting ambulatory sats,

## 2018-04-13 NOTE — Plan of Care (Signed)
  Problem: Education: Goal: Knowledge of General Education information will improve Description Including pain rating scale, medication(s)/side effects and non-pharmacologic comfort measures Outcome: Progressing   Problem: Clinical Measurements: Goal: Ability to maintain clinical measurements within normal limits will improve Outcome: Progressing   Problem: Health Behavior/Discharge Planning: Goal: Ability to manage health-related needs will improve Outcome: Progressing   Problem: Nutrition: Goal: Adequate nutrition will be maintained Outcome: Progressing   Problem: Coping: Goal: Level of anxiety will decrease Outcome: Progressing   Problem: Pain Managment: Goal: General experience of comfort will improve Outcome: Progressing

## 2018-04-13 NOTE — Progress Notes (Signed)
SATURATION QUALIFICATIONS: (This note is used to comply with regulatory documentation for home oxygen)  Patient Saturations on Room Air at Rest = 82%  Patient Saturations on Room Air while Ambulating = 82%  Patient Saturations on  6 Liters of oxygen while Ambulating = 90%  Please briefly explain why patient needs home oxygen: Pt required up to 6L of O2 when ambulating. Patient also had to take frequent stops to catch breath.

## 2018-04-14 LAB — CBC WITH DIFFERENTIAL/PLATELET
Abs Immature Granulocytes: 0.31 10*3/uL — ABNORMAL HIGH (ref 0.00–0.07)
Basophils Absolute: 0 10*3/uL (ref 0.0–0.1)
Basophils Relative: 0 %
EOS ABS: 0 10*3/uL (ref 0.0–0.5)
Eosinophils Relative: 0 %
HCT: 41 % (ref 39.0–52.0)
Hemoglobin: 13 g/dL (ref 13.0–17.0)
Immature Granulocytes: 1 %
Lymphocytes Relative: 8 %
Lymphs Abs: 1.7 10*3/uL (ref 0.7–4.0)
MCH: 29.5 pg (ref 26.0–34.0)
MCHC: 31.7 g/dL (ref 30.0–36.0)
MCV: 93.2 fL (ref 80.0–100.0)
Monocytes Absolute: 1.6 10*3/uL — ABNORMAL HIGH (ref 0.1–1.0)
Monocytes Relative: 7 %
Neutro Abs: 18.8 10*3/uL — ABNORMAL HIGH (ref 1.7–7.7)
Neutrophils Relative %: 84 %
Platelets: 210 10*3/uL (ref 150–400)
RBC: 4.4 MIL/uL (ref 4.22–5.81)
RDW: 13.2 % (ref 11.5–15.5)
WBC: 22.5 10*3/uL — ABNORMAL HIGH (ref 4.0–10.5)
nRBC: 0 % (ref 0.0–0.2)

## 2018-04-14 LAB — BASIC METABOLIC PANEL
Anion gap: 6 (ref 5–15)
BUN: 20 mg/dL (ref 6–20)
CO2: 40 mmol/L — ABNORMAL HIGH (ref 22–32)
Calcium: 9.1 mg/dL (ref 8.9–10.3)
Chloride: 94 mmol/L — ABNORMAL LOW (ref 98–111)
Creatinine, Ser: 0.89 mg/dL (ref 0.61–1.24)
GFR calc Af Amer: >60 mL/min (ref >60)
Glucose, Bld: 118 mg/dL — ABNORMAL HIGH (ref 70–99)
POTASSIUM: 4.5 mmol/L (ref 3.5–5.1)
Sodium: 140 mmol/L (ref 135–145)

## 2018-04-14 MED ORDER — LEVALBUTEROL HCL 1.25 MG/0.5ML IN NEBU: 1.2500 mg | INHALATION_SOLUTION | Freq: Two times a day (BID) | RESPIRATORY_TRACT | Status: DC

## 2018-04-14 MED ORDER — IPRATROPIUM BROMIDE 0.02 % IN SOLN: 0.5000 mg | Freq: Two times a day (BID) | RESPIRATORY_TRACT | Status: DC

## 2018-04-14 MED ORDER — ALPRAZOLAM 0.5 MG PO TABS: 0.5000 mg | ORAL_TABLET | Freq: Every day | ORAL | 0 refills | Status: AC | PRN

## 2018-04-14 MED ORDER — LEVALBUTEROL TARTRATE 45 MCG/ACT IN AERO: 2.0000 | INHALATION_SPRAY | RESPIRATORY_TRACT | 0 refills | Status: AC | PRN

## 2018-04-14 NOTE — Care Management Note (Addendum)
Case Management Note  Patient Details  Name: Geoffrey Leonard MRN: [id number] Date of Birth: Jan 14, 1956  Subjective/Objective:      From home, pta indep, acute hypoxic resp failure, elevated lactic acid, leukocytosis, mild aki, tobacco abuse, hyperglycemia.  Patient for likely dc tomorrow 1/15.  He has Insurance but no PCP, NCM shceduled apt for him at the Primary Care at [facility] on 1/29 at 2:10.  NCM provided patient with brochure for Primary care at [facility] and [facility] clinic.  Patient will need home oxygen, he would like [facility] , NCM contacted Jermaine with [facility] , he will bring oxygen up to patient's room prior to dc.    Oxygen delivered at 4:42.                            Action/Plan: DC home when oxygen received .  Expected Discharge Date:  04/13/18               Expected Discharge Plan:  Home/Self Care  In-House Referral:     Discharge planning Services  CM Consult, Follow-up appt scheduled  Post Acute Care Choice:    Choice offered to:  Patient  DME Arranged:  Oxygen DME Agency:  [facility]  HH Arranged:    [facility] Agency:     Status of Service:  Completed, signed off  If discussed at Long Length of Stay Meetings, dates discussed:    Additional Comments:  Leone Haven, RN 04/14/2018, 4:03 PM

## 2018-04-14 NOTE — Progress Notes (Signed)
SATURATION QUALIFICATIONS: (This note is used to comply with regulatory documentation for home oxygen)  Patient Saturations on Room Air at Rest = 88%  Patient Saturations on Room Air while Ambulating = 86%  Patient Saturations on 3 Liters of oxygen while Ambulating = 91%  Please briefly explain why patient needs home oxygen: Patient displayed shortness of breath when ambulating

## 2018-04-15 LAB — CULTURE, BLOOD (ROUTINE X 2)
Culture: NO GROWTH
Culture: NO GROWTH
Special Requests: ADEQUATE
Special Requests: ADEQUATE

## 2018-04-18 NOTE — Discharge Summary (Signed)
[facility] Discharge Summary   Patient: Geoffrey RosenthalJeffrey Devore WUJ:[id number]   PCP: Patient, No Pcp Per DOB: January 23, 1956   Date of admission: 04/10/2018   Date of discharge: 04/14/2018     Discharge Diagnoses:  Principal Problem:   COPD exacerbation (HCC) Active Problems:   Acute respiratory failure with hypoxia and hypercapnia (HCC)   AKI (acute kidney injury) (HCC)   Tobacco abuse   Sepsis (HCC)   Admitted From: home Disposition:  home  Recommendations for Outpatient Follow-up:  1. Please follow-up with PCP in 1 week  Follow-up Information    [facility] Follow up.   Why:  Appointment: April 27, 2018 @ 2:10 pm   Phone number to reschedule is [phone] Contact information: [address] [place] [phone]       [facility] - Dme Follow up.   Why:  oxygen Contact information: [address] [place] [address] [phone]          Diet recommendation: cardiac diet  Activity: The patient is advised to gradually reintroduce usual activities.  Discharge Condition: good  Code Status: full code  History of present illness: As per the H and P dictated on admission, " Geoffrey RosenthalJeffrey Rendell is a 63 y.o. male with medical history significant of tobacco abuse, who presents with shortness of breath and cough.  Patient states that he has been having cough and shortness of breath for more than 3 days after exposed to disel type of chemical.  He has productive cough with brownish colored sputum production.  Denies any chest pain.  He can barely speak in full sentences in the ED.  Oxygen saturation 87% on room air, requiring 5 to 6 L mask oxygen.  He has chills, but no fever.  No nausea vomiting, diarrhea, abdominal pain, symptoms of UTI or unilateral weakness.  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  Denies tenderness in the calf areas.  Patient states that he has heavily  smoked for more than 20 years, but stopped smoking 3 days ago.  ED Course: pt was found to have negative troponin, lactic acid 3.35, 5.37, AKI with creatinine 1.29, BUN 18, temperature 99.2, tachycardia, tachypnea.  Repeat ABG pH 7.35, CO2 60, O2 97. Chest x-ray negative for infiltration.  Patient is admitted to stepdown bed as inpatient."  Hospital Course:  Summary of his active problems in the hospital is as following. Acute hypoxic respiratory failure COPD exacerbation -While the patient does not have diagnosis of COPD is a CT scan of the chest suggest moderate centrilobular emphysema suggesting undiagnosed COPD with history of smoking. Acute presentation due to COPD exacerbation/bronchitis Blood cultures are negative other than mildly elevated, influenza PCR negative. Underwent CT scan of the chest negative for PE, lower externally Doppler negative for DVT. CT scan of the chest also negative for any other acute abnormality including pneumonia. Treated with IV steroids, nebulization as well as oral antibiotics with significant improvement. Initially on Ventimask was able to drop down to room air at rest and 4 L on exertion. Will probably not need oxygen for prolonged. Continue incentive spirometer  Elevated lactic acid Sepsis ruled out, SIRS secondary to respiratory distress -Likely secondary to respiratory distress -no evidence of sepsis, afebrile, nontoxic -Influenza PCR is still pending, ordered yesterday a.m.  Leukocytosis -Severe, acute, likely secondary to steroids, stress demargination  AKI -Resolved  Tobacco abuse -Counseled, patient declines nicotine patch  Hyperglycemia -Secondary  to steroids, sliding scale insulin  Anxiety. Patient was started on Xanax.  Would like to continue Xanax on discharge. [place] Controlled Substance Reporting System database was reviewed. 5 day supply was provided. Patient was recommended to follow-up with PCP for further  prescriptions for definitive treatment. Patient is a truck driver and was clearly instructed not to drive, operate heavy machinery, perform activities at heights, swimming or participation in water activities or provide baby sitting services while on Pain, Sleep and Anxiety Medications; until his outpatient Physician has advised to do so again.  - Also recommended to not to take more than prescribed Pain, Sleep and Anxiety Medications.  Patient was ambulatory without any assistance. On the day of the discharge the patient's vitals were stable , and no other acute medical condition were reported by patient. the patient was felt safe to be discharge at home with family.  Consultants: none Procedures: none  DISCHARGE MEDICATION: Allergies as of 04/14/2018      Reactions   Latex Rash   Reaction to condoms      Medication List    TAKE these medications   ALPRAZolam 0.5 MG tablet Commonly known as:  XANAX Take 1 tablet (0.5 mg total) by mouth daily as needed for anxiety.   dextromethorphan-guaiFENesin 30-600 MG 12hr tablet Commonly known as:  MUCINEX DM Take 1 tablet by mouth 2 (two) times daily.   feeding supplement (ENSURE ENLIVE) Liqd Take 237 mLs by mouth 2 (two) times daily between meals.   fluticasone 50 MCG/ACT nasal spray Commonly known as:  FLONASE Place 1 spray into both nostrils daily.   ibuprofen 200 MG tablet Commonly known as:  ADVIL,MOTRIN Take 800 mg by mouth every 6 (six) hours as needed for headache (pain).   levalbuterol 45 MCG/ACT inhaler Commonly known as:  XOPENEX HFA Inhale 2 puffs into the lungs every 4 (four) hours as needed for wheezing.   multivitamin with minerals Tabs tablet Take 1 tablet by mouth daily.   nicotine 21 mg/24hr patch Commonly known as:  NICODERM CQ - dosed in mg/24 hours Place 1 patch (21 mg total) onto the skin daily. Notes to patient:  04/15/18   pantoprazole 40 MG tablet Commonly known as:  PROTONIX Take 1 tablet (40 mg  total) by mouth daily at 12 noon.   predniSONE 10 MG tablet Commonly known as:  DELTASONE Take 40mg  daily for 3days,Take 30mg  daily for 3days,Take 20mg  daily for 3days,Take 10mg  daily for 3days, then stop   vitamin C 500 MG tablet Commonly known as:  ASCORBIC ACID Take 500 mg by mouth daily.     ASK your doctor about these medications   azithromycin 250 MG tablet Commonly known as:  ZITHROMAX Take 1 tablet (250 mg total) by mouth daily for 2 days. Ask about: Should I take this medication?      Allergies  Allergen Reactions  . Latex Rash    Reaction to condoms   Discharge Instructions    Diet - low sodium heart healthy   Complete by:  As directed    Discharge instructions   Complete by:  As directed    It is important that you read the given instructions as well as go over your medication list with RN to help you understand your care after this hospitalization.  Discharge Instructions: Please follow-up with PCP in 1-2 weeks  Please request your primary care physician to go over all Hospital Tests and Procedure/Radiological results at the follow up. Please get all [facility]  records sent to your PCP by signing hospital release before you go home.   Do not drive, operating heavy machinery, perform activities at heights, swimming or participation in water activities or provide baby sitting services while you are on Pain, Sleep and Anxiety Medications; until you have been seen by Primary Care Physician or a Neurologist and advised to do so again. Do not take more than prescribed Pain, Sleep and Anxiety Medications. You were cared for by a hospitalist during your hospital stay. If you have any questions about your discharge medications or the care you received while you were in the hospital after you are discharged, you can call the unit @UNIT @ you were admitted to and ask to speak with the hospitalist on call if the hospitalist that took care of you is not available.  Once you are  discharged, your primary care physician will handle any further medical issues. Please note that NO REFILLS for any discharge medications will be authorized once you are discharged, as it is imperative that you return to your primary care physician (or establish a relationship with a primary care physician if you do not have one) for your aftercare needs so that they can reassess your need for medications and monitor your lab values. You Must read complete instructions/literature along with all the possible adverse reactions/side effects for all the Medicines you take and that have been prescribed to you. Take any new Medicines after you have completely understood and accept all the possible adverse reactions/side effects. Wear Seat belts while driving. If you have smoked or chewed Tobacco in the last 2 yrs please stop smoking and/or stop any Recreational drug use.  If you drink alcohol, please moderate the use and do not drive, operating heavy machinery, perform activities at heights, swimming or participation in water activities or provide baby sitting services under influence.   Increase activity slowly   Complete by:  As directed      Discharge Exam: Filed Weights   04/10/18 2149  Weight: 70.6 kg   Vitals:   04/14/18 0828 04/14/18 0851  BP: (!) 142/85   Pulse: 91   Resp:    Temp: 97.7 F (36.5 C)   SpO2: 100% 98%   General: Appear in no distress, no Rash; Oral Mucosa moist. Cardiovascular: S1 and S2 Present, no Murmur, no JVD Respiratory: Bilateral Air entry present and Clear to Auscultation, no Crackles, no wheezes Abdomen: Bowel Sound present, Soft and no tenderness Extremities: no Pedal edema, no calf tenderness Neurology: Grossly no focal neuro deficit.  The results of significant diagnostics from this hospitalization (including imaging, microbiology, ancillary and laboratory) are listed below for reference.    Significant Diagnostic Studies: Ct Angio Chest Pe W Or Wo  Contrast  Result Date: 04/11/2018 CLINICAL DATA:  63 y/o M; PE suspected, intermediate prob, positive D-dimer. EXAM: CT ANGIOGRAPHY CHEST WITH CONTRAST TECHNIQUE: Multidetector CT imaging of the chest was performed using the standard protocol during bolus administration of intravenous contrast. Multiplanar CT image reconstructions and MIPs were obtained to evaluate the vascular anatomy. CONTRAST:  52mL ISOVUE-370 IOPAMIDOL (ISOVUE-370) INJECTION 76% COMPARISON:  04/10/2017 chest radiograph FINDINGS: Cardiovascular: Satisfactory opacification of the pulmonary arteries to the segmental level. No evidence of pulmonary embolism. Normal heart size. No pericardial effusion. Mild aortic and moderate coronary artery calcific atherosclerosis. Mediastinum/Nodes: No enlarged mediastinal, hilar, or axillary lymph nodes. Thyroid gland, trachea, and esophagus demonstrate no significant findings. Lungs/Pleura: Peribronchial thickening and scattered mucous plugging. Scattered clusters of tiny nodules in bronchovascular  distribution. No consolidation, effusion, or pneumothorax. Moderate centrilobular emphysema. Upper Abdomen: 20 mm left adrenal nodule measuring 2 HU compatible with adenoma. Musculoskeletal: No chest wall abnormality. No acute or significant osseous findings. Review of the MIP images confirms the above findings. IMPRESSION: 1. No pulmonary embolus identified. 2. Peribronchial thickening and scattered clustered nodules in bronchovascular distribution compatible with mild bronchitis/bronchiolitis. No consolidation. 3. Aortic Atherosclerosis (ICD10-I70.0) and Emphysema (ICD10-J43.9). 4. Moderate coronary artery calcific atherosclerosis. 5. Left adrenal adenoma. Electronically Signed   By: Mitzi Hansen M.D.   On: 04/11/2018 03:03   Dg Chest Port 1 View  Result Date: 04/10/2018 CLINICAL DATA:  Shortness of breath. EXAM: PORTABLE CHEST 1 VIEW COMPARISON:  None. FINDINGS: The right costophrenic angle is  excluded from the field of view. The heart size and mediastinal contours are within normal limits. Normal pulmonary vascularity. Mild diffusely coarsened interstitial markings are likely smoking-related. No consolidation, pneumothorax, or large pleural effusion. No acute osseous abnormality. IMPRESSION: No active disease. Electronically Signed   By: Obie Dredge M.D.   On: 04/10/2018 18:01   Vas [place] Lower Extremity Venous (dvt)  Result Date: 04/11/2018  Lower Venous Study Indications: Positive d dimer.  Performing Technologist: Blanch Media RVS  Examination Guidelines: A complete evaluation includes B-mode imaging, spectral Doppler, color Doppler, and power Doppler as needed of all accessible portions of each vessel. Bilateral testing is considered an integral part of a complete examination. Limited examinations for reoccurring indications may be performed as noted.  Right Venous Findings: +---------+---------------+---------+-----------+----------+-------+          CompressibilityPhasicitySpontaneityPropertiesSummary +---------+---------------+---------+-----------+----------+-------+ CFV      Full           Yes      Yes                          +---------+---------------+---------+-----------+----------+-------+ SFJ      Full                                                 +---------+---------------+---------+-----------+----------+-------+ FV Prox  Full                                                 +---------+---------------+---------+-----------+----------+-------+ FV Mid   Full                                                 +---------+---------------+---------+-----------+----------+-------+ FV DistalFull                                                 +---------+---------------+---------+-----------+----------+-------+ PFV      Full                                                 +---------+---------------+---------+-----------+----------+-------+ POP       Full  Yes      Yes                          +---------+---------------+---------+-----------+----------+-------+ PTV      Full                                                 +---------+---------------+---------+-----------+----------+-------+ PERO     Full                                                 +---------+---------------+---------+-----------+----------+-------+  Left Venous Findings: +---------+---------------+---------+-----------+----------+-------+          CompressibilityPhasicitySpontaneityPropertiesSummary +---------+---------------+---------+-----------+----------+-------+ CFV      Full           Yes      Yes                          +---------+---------------+---------+-----------+----------+-------+ SFJ      Full                                                 +---------+---------------+---------+-----------+----------+-------+ FV Prox  Full                                                 +---------+---------------+---------+-----------+----------+-------+ FV Mid   Full                                                 +---------+---------------+---------+-----------+----------+-------+ FV DistalFull                                                 +---------+---------------+---------+-----------+----------+-------+ PFV      Full                                                 +---------+---------------+---------+-----------+----------+-------+ POP      Full           Yes      Yes                          +---------+---------------+---------+-----------+----------+-------+ PTV      Full                                                 +---------+---------------+---------+-----------+----------+-------+ PERO     Full                                                 +---------+---------------+---------+-----------+----------+-------+  Summary: Right: There is no evidence of deep vein thrombosis in  the lower extremity. No cystic structure found in the popliteal fossa. Left: There is no evidence of deep vein thrombosis in the lower extremity. No cystic structure found in the popliteal fossa.  *See table(s) above for measurements and observations. Electronically signed by Waverly Ferrarihristopher Dickson MD on 04/11/2018 at 10:42:14 AM.    Final     Microbiology: Recent Results (from the past 240 hour(s))  Culture, blood (x 2)     Status: None   Collection Time: 04/10/18  8:32 PM  Result Value Ref Range Status   Specimen Description BLOOD RIGHT ANTECUBITAL  Final   Special Requests   Final    BOTTLES DRAWN AEROBIC AND ANAEROBIC Blood Culture adequate volume   Culture   Final    NO GROWTH 5 DAYS Performed at [facility], [address] [place], [place] [address]    Report Status 04/15/2018 FINAL  Final  Culture, blood (x 2)     Status: None   Collection Time: 04/10/18  8:40 PM  Result Value Ref Range Status   Specimen Description BLOOD BLOOD LEFT HAND  Final   Special Requests   Final    BOTTLES DRAWN AEROBIC AND ANAEROBIC Blood Culture adequate volume   Culture   Final    NO GROWTH 5 DAYS Performed at [facility], [address] [place], [place] [address]    Report Status 04/15/2018 FINAL  Final  MRSA PCR Screening     Status: None   Collection Time: 04/10/18 10:11 PM  Result Value Ref Range Status   MRSA by PCR NEGATIVE NEGATIVE Final    Comment:        The GeneXpert MRSA Assay (FDA approved for NASAL specimens only), is one component of a comprehensive MRSA colonization surveillance program. It is not intended to diagnose MRSA infection nor to guide or monitor treatment for MRSA infections. Performed at [facility], [address] [place], [place] [address]      Labs: CBC: Recent Labs  Lab 04/12/18 0906 04/13/18 0319 04/14/18 0225  WBC 33.1* 28.5* 22.5*  NEUTROABS  --   --  18.8*  HGB 13.9 12.0* 13.0  HCT 44.8 39.7 41.0  MCV 92.6 93.0 93.2  PLT  223 203 210   Basic Metabolic Panel: Recent Labs  Lab 04/12/18 0906 04/13/18 0319 04/14/18 0225  NA 140 141 140  K 4.5 5.0 4.5  CL 98 96* 94*  CO2 34* 38* 40*  GLUCOSE 154* 177* 118*  BUN 18 19 20   CREATININE 0.99 0.90 0.89  CALCIUM 9.1 9.0 9.1   Liver Function Tests: No results for input(s): AST, ALT, ALKPHOS, BILITOT, PROT, ALBUMIN in the last 168 hours. No results for input(s): LIPASE, AMYLASE in the last 168 hours. No results for input(s): AMMONIA in the last 168 hours. Cardiac Enzymes: No results for input(s): CKTOTAL, CKMB, CKMBINDEX, TROPONINI in the last 168 hours. BNP (last 3 results) No results for input(s): BNP in the last 8760 hours. CBG: No results for input(s): GLUCAP in the last 168 hours. Time spent: 35 minutes  Signed:  Lynden Oxfordranav Klair Leising  [facility] 04/14/2018

## 2018-04-27 ENCOUNTER — Ambulatory Visit (INDEPENDENT_AMBULATORY_CARE_PROVIDER_SITE_OTHER): Payer: Self-pay | Admitting: Family Medicine

## 2018-04-27 ENCOUNTER — Encounter: Payer: Self-pay | Admitting: Family Medicine

## 2018-04-27 VITALS — BP 157/84 | HR 97 | Resp 17 | Ht 64.0 in | Wt 159.8 lb

## 2018-04-27 DIAGNOSIS — J441 Chronic obstructive pulmonary disease with (acute) exacerbation: Secondary | ICD-10-CM

## 2018-04-27 DIAGNOSIS — I10 Essential (primary) hypertension: Secondary | ICD-10-CM

## 2018-04-27 DIAGNOSIS — N179 Acute kidney failure, unspecified: Secondary | ICD-10-CM

## 2018-04-27 DIAGNOSIS — Z7689 Persons encountering health services in other specified circumstances: Secondary | ICD-10-CM

## 2018-04-27 NOTE — Progress Notes (Signed)
Geoffrey Leonard, is a 63 y.o. male  MVH:[id number]  WUX:[id number]  DOB - 1955-12-15  CC:  Chief Complaint  Patient presents with  . Establish Care  . Hospitalization Follow-up    ED->Hosp 1/12-1/16: COPD exacerbation, AKI, sepsis      HPI: Geoffrey Leonard is a 63 y.o. male is here today to establish care.   Geoffrey Leonard has Acute respiratory failure with hypoxia and hypercapnia (HCC); AKI (acute kidney injury) (HCC); Tobacco abuse; Sepsis (HCC); and COPD exacerbation (HCC) on their problem list.    Patient presents today for hospital follow-up. Recently hospitalized due to acute respiratory failure secondary to COPD exacerbation. Patient is adamant that he doesn't suffer from COPD. He is a chronic smoker although reports he has not smoked in over 1 month since he was hospitalized. He is a Naval architect and reports that days prior to hospital admission he inhaled a chemical and subsequently developed a cough, shortness of breath, and wheezing. During hospitalization, he was hypoxic with oxygen saturation 87%,  requiring oxygen 5-6 liters. Labs were significant for elevated creatine 1.29 and BUN 18 which indicated AKI. ABG indicated hypercapnia. Patient developed anxiety due to respiratory distress and was started in Xanax which he reports he is no longer taking.  He was discharged with home oxygen which he used two days following discharge and has not needed since. He denies shortness of breath. Continues to have a mild non-productive cough. He is not smoking. Denies use of short-acting inhaler. He is requesting clearance to return to work. His blood pressure is elevated in arrival today x 2 readings. Reports he doesn't have hypertension and states "if if do I will not take medication". Denies new headaches, chest pain, abdominal pain, nausea, new weakness , numbness or tingling, SOB, edema, or worrisome cough.     Current medications: Current Outpatient Medications:  .  ALPRAZolam (XANAX) 0.5 MG  tablet, Take 1 tablet (0.5 mg total) by mouth daily as needed for anxiety., Disp: 5 tablet, Rfl: 0 .  feeding supplement, ENSURE ENLIVE, (ENSURE ENLIVE) LIQD, Take 237 mLs by mouth 2 (two) times daily between meals., Disp: 237 mL, Rfl: 12 .  fluticasone (FLONASE) 50 MCG/ACT nasal spray, Place 1 spray into both nostrils daily., Disp: 16 g, Rfl: 0 .  levalbuterol (XOPENEX HFA) 45 MCG/ACT inhaler, Inhale 2 puffs into the lungs every 4 (four) hours as needed for wheezing., Disp: 1 Inhaler, Rfl: 0 .  Multiple Vitamin (MULTIVITAMIN WITH MINERALS) TABS tablet, Take 1 tablet by mouth daily., Disp: , Rfl:  .  nicotine (NICODERM CQ - DOSED IN MG/24 HOURS) 21 mg/24hr patch, Place 1 patch (21 mg total) onto the skin daily., Disp: 28 patch, Rfl: 0 .  pantoprazole (PROTONIX) 40 MG tablet, Take 1 tablet (40 mg total) by mouth daily at 12 noon., Disp: 14 tablet, Rfl: 0 .  vitamin C (ASCORBIC ACID) 500 MG tablet, Take 500 mg by mouth daily., Disp: , Rfl:    Pertinent family medical history: family history includes Diabetes in his maternal aunt; Hypertension in his brother.   Allergies  Allergen Reactions  . Latex Rash    Reaction to condoms    Social History   Socioeconomic History  . Marital status: Unknown    Spouse name: Not on file  . Number of children: Not on file  . Years of education: Not on file  . Highest education level: Not on file  Occupational History  . Not on file  Social Needs  . Physicist, medical  strain: Not on file  . Food insecurity:    Worry: Not on file    Inability: Not on file  . Transportation needs:    Medical: Not on file    Non-medical: Not on file  Tobacco Use  . Smoking status: Current Every Day Smoker    Years: 20.00    Types: Cigarettes  . Smokeless tobacco: Former NeurosurgeonUser    Quit date: 04/07/2018  Substance and Sexual Activity  . Alcohol use: No  . Drug use: Not on file  . Sexual activity: Not on file  Lifestyle  . Physical activity:    Days per week: Not on  file    Minutes per session: Not on file  . Stress: Not on file  Relationships  . Social connections:    Talks on phone: Not on file    Gets together: Not on file    Attends religious service: Not on file    Active member of club or organization: Not on file    Attends meetings of clubs or organizations: Not on file    Relationship status: Not on file  . Intimate partner violence:    Fear of current or ex partner: Not on file    Emotionally abused: Not on file    Physically abused: Not on file    Forced sexual activity: Not on file  Other Topics Concern  . Not on file  Social History Narrative  . Not on file   Review of Systems: Pertinent negatives listed in HPI  Objective:   Vitals:   04/27/18 1420 04/27/18 1527  BP: (!) 161/83 (!) 157/84  Pulse: 97   Resp: 17   SpO2: 95%     BP Readings from Last 3 Encounters:  04/27/18 (!) 161/83  04/14/18 (!) 142/85  04/30/14 161/77    Filed Weights   04/27/18 1420  Weight: 159 lb 12.8 oz (72.5 kg)      Physical Exam: Constitutional: Patient appears well-developed and well-nourished. No distress. HENT: Normocephalic, atraumatic, External right and left ear normal. Oropharynx is clear and moist.  Eyes: Conjunctivae and EOM are normal. PERRLA, no scleral icterus. Neck: Normal ROM. Neck supple. No JVD. No tracheal deviation. No thyromegaly. CVS: RRR, S1/S2 +, no murmurs, no gallops, no carotid bruit.  Pulmonary: Diminished lung sounds, rhonchi noted which clears with cough. Effort normal. Abdominal: Soft. BS +, no distension, tenderness, rebound or guarding.  Musculoskeletal: Normal range of motion. No edema and no tenderness.  Neuro: Alert. Normal muscle tone coordination. Normal gait. BUE and BLE strength 5/5. Skin: Skin is warm and dry. No rash noted. Not diaphoretic. No erythema. No pallor. Psychiatric: Normal mood and affect. Behavior, judgment, thought content normal.  Lab Results (prior encounters)  Lab Results   Component Value Date   WBC 22.5 (H) 04/14/2018   HGB 13.0 04/14/2018   HCT 41.0 04/14/2018   MCV 93.2 04/14/2018   PLT 210 04/14/2018   Lab Results  Component Value Date   CREATININE 0.89 04/14/2018   BUN 20 04/14/2018   NA 140 04/14/2018   K 4.5 04/14/2018   CL 94 (L) 04/14/2018   CO2 40 (H) 04/14/2018       Assessment and plan:  1. Encounter to establish care  2. COPD exacerbation (HCC) -Currently stable Explained again the harms of smoking and encouraged him to continue cessation. He declines medication or patches to assist with continued cessation. Continue Levabuterol as needed for shortness of breath or wheezing Discontinued home oxygen.  [facility] notified of order.  Checking: - Comprehensive metabolic panel - CBC with Differential  3. AKI (acute kidney injury) (HCC) Checking: - Comprehensive metabolic panel  4. Elevated blood pressure reading in office with diagnosis of hypertension -I suspect patient has hypertension. He is unwilling to address topic and refuses to discuss possibility of medication. Advised of the adverse effects of untreated hypertension regarding overall cardiovascular health.    Declined all immunizations. Refused    RTC: as  Needed for CPE  The patient was given clear instructions to go to ER or return to medical center if symptoms don't improve, worsen or new problems develop. The patient verbalized understanding. The patient was advised  to call and obtain lab results if they haven't heard anything from out office within 7-10 business days.  Joaquin CourtsKimberly Falyn Rubel, FNP [facility] [address], [place] [id number] [phone]: [phone]    This note has been created with Dragon speech recognition software and Paediatric nursesmart phrase technology. Any transcriptional errors are unintentional.

## 2018-04-27 NOTE — Patient Instructions (Addendum)
Thank you for choosing Primary Care at [facility] to be your medical home!    Geoffrey Leonard was seen by Geoffrey Courts, FNP today.   Geoffrey Leonard primary care provider is Geoffrey Neighbors, FNP.   For the best care possible, you should try to see Geoffrey Courts, FNP-C whenever you come to the clinic.   We look forward to seeing you again soon!  If you have any questions about your visit today, please call us at [phone] or feel free to reach your primary care provider via MyChart.      Great Job on quitting smoking!   Coping with Quitting Smoking  Quitting smoking is a physical and mental challenge. You will face cravings, withdrawal symptoms, and temptation. Before quitting, work with your health care provider to make a plan that can help you cope. Preparation can help you quit and keep you from giving in. How can I cope with cravings? Cravings usually last for 5-10 minutes. If you get through it, the craving will pass. Consider taking the following actions to help you cope with cravings:  Keep your mouth busy: ? Chew sugar-free gum. ? Suck on hard candies or a straw. ? Brush your teeth.  Keep your hands and body busy: ? Immediately change to a different activity when you feel a craving. ? Squeeze or play with a ball. ? Do an activity or a hobby, like making bead jewelry, practicing needlepoint, or working with wood. ? Mix up your normal routine. ? Take a short exercise break. Go for a quick walk or run up and down stairs. ? Spend time in public places where smoking is not allowed.  Focus on doing something kind or helpful for someone else.  Call a friend or family member to talk during a craving.  Join a support group.  Call a quit line, such as 1-800-QUIT-NOW.  Talk with your health care provider about medicines that might help you cope with cravings and make quitting easier for you. How can I deal with withdrawal symptoms? Your body may experience  negative effects as it tries to get used to not having nicotine in the system. These effects are called withdrawal symptoms. They may include:  Feeling hungrier than normal.  Trouble concentrating.  Irritability.  Trouble sleeping.  Feeling depressed.  Restlessness and agitation.  Craving a cigarette. To manage withdrawal symptoms:  Avoid places, people, and activities that trigger your cravings.  Remember why you want to quit.  Get plenty of sleep.  Avoid coffee and other caffeinated drinks. These may worsen some of your symptoms. How can I handle social situations? Social situations can be difficult when you are quitting smoking, especially in the first few weeks. To manage this, you can:  Avoid parties, bars, and other social situations where people might be smoking.  Avoid alcohol.  Leave right away if you have the urge to smoke.  Explain to your family and friends that you are quitting smoking. Ask for understanding and support.  Plan activities with friends or family where smoking is not an option. What are some ways I can cope with stress? Wanting to smoke may cause stress, and stress can make you want to smoke. Find ways to manage your stress. Relaxation techniques can help. For example:  Breathe slowly and deeply, in through your nose and out through your mouth.  Listen to soothing, relaxing music.  Talk with a family member or friend about your stress.  Light a candle.  Soak  in a bath or take a shower.  Think about a peaceful place. What are some ways I can prevent weight gain? Be aware that many people gain weight after they quit smoking. However, not everyone does. To keep from gaining weight, have a plan in place before you quit and stick to the plan after you quit. Your plan should include:  Having healthy snacks. When you have a craving, it may help to: ? Eat plain popcorn, crunchy carrots, celery, or other cut vegetables. ? Chew sugar-free  gum.  Changing how you eat: ? Eat small portion sizes at meals. ? Eat 4-6 small meals throughout the day instead of 1-2 large meals a day. ? Be mindful when you eat. Do not watch television or do other things that might distract you as you eat.  Exercising regularly: ? Make time to exercise each day. If you do not have time for a long workout, do short bouts of exercise for 5-10 minutes several times a day. ? Do some form of strengthening exercise, like weight lifting, and some form of aerobic exercise, like running or swimming.  Drinking plenty of water or other low-calorie or no-calorie drinks. Drink 6-8 glasses of water daily, or as much as instructed by your health care provider. Summary  Quitting smoking is a physical and mental challenge. You will face cravings, withdrawal symptoms, and temptation to smoke again. Preparation can help you as you go through these challenges.  You can cope with cravings by keeping your mouth busy (such as by chewing gum), keeping your body and hands busy, and making calls to family, friends, or a helpline for people who want to quit smoking.  You can cope with withdrawal symptoms by avoiding places where people smoke, avoiding drinks with caffeine, and getting plenty of rest.  Ask your health care provider about the different ways to prevent weight gain, avoid stress, and handle social situations. This information is not intended to replace advice given to you by your health care provider. Make sure you discuss any questions you have with your health care provider. Document Released: 03/13/2016 Document Revised: 03/13/2016 Document Reviewed: 03/13/2016 Elsevier Interactive Patient Education  2019 [employer].

## 2018-04-28 LAB — CBC WITH DIFFERENTIAL/PLATELET
Basophils Absolute: 0.1 10*3/uL (ref 0.0–0.2)
Basos: 1 %
EOS (ABSOLUTE): 0.2 10*3/uL (ref 0.0–0.4)
Eos: 2 %
Hematocrit: 42.1 % (ref 37.5–51.0)
Hemoglobin: 14.3 g/dL (ref 13.0–17.7)
IMMATURE GRANULOCYTES: 2 %
Immature Grans (Abs): 0.2 10*3/uL — ABNORMAL HIGH (ref 0.0–0.1)
Lymphocytes Absolute: 3.2 10*3/uL — ABNORMAL HIGH (ref 0.7–3.1)
Lymphs: 26 %
MCH: 29.4 pg (ref 26.6–33.0)
MCHC: 34 g/dL (ref 31.5–35.7)
MCV: 87 fL (ref 79–97)
Monocytes Absolute: 1.2 10*3/uL — ABNORMAL HIGH (ref 0.1–0.9)
Monocytes: 9 %
Neutrophils Absolute: 7.6 10*3/uL — ABNORMAL HIGH (ref 1.4–7.0)
Neutrophils: 60 %
Platelets: 302 10*3/uL (ref 150–450)
RBC: 4.86 x10E6/uL (ref 4.14–5.80)
RDW: 13 % (ref 11.6–15.4)
WBC: 12.5 10*3/uL — AB (ref 3.4–10.8)

## 2018-04-28 LAB — COMPREHENSIVE METABOLIC PANEL
ALT: 33 IU/L (ref 0–44)
AST: 20 IU/L (ref 0–40)
Albumin/Globulin Ratio: 2 (ref 1.2–2.2)
Albumin: 4.3 g/dL (ref 3.8–4.9)
Alkaline Phosphatase: 54 IU/L (ref 39–117)
BILIRUBIN TOTAL: 0.5 mg/dL (ref 0.0–1.2)
BUN/Creatinine Ratio: 15 (ref 10–24)
BUN: 19 mg/dL (ref 8–27)
CHLORIDE: 97 mmol/L (ref 96–106)
CO2: 27 mmol/L (ref 20–29)
Calcium: 9.6 mg/dL (ref 8.6–10.2)
Creatinine, Ser: 1.27 mg/dL (ref 0.76–1.27)
GFR calc Af Amer: 71 mL/min/{1.73_m2} (ref >59)
GFR calc non Af Amer: 61 mL/min/{1.73_m2} (ref >59)
Globulin, Total: 2.2 g/dL (ref 1.5–4.5)
Glucose: 84 mg/dL (ref 65–99)
Potassium: 5.5 mmol/L — ABNORMAL HIGH (ref 3.5–5.2)
Sodium: 139 mmol/L (ref 134–144)
Total Protein: 6.5 g/dL (ref 6.0–8.5)

## 2018-05-01 ENCOUNTER — Telehealth: Payer: Self-pay | Admitting: Family Medicine

## 2018-05-01 NOTE — Telephone Encounter (Signed)
Contact patient to advise potassium is elevated and white count remains mildly elevated which could indicate residual respiratory infection or inflammation. I would like for him to return on this week for repeat blood work to recheck potassium and white count.  Schedule lab appointment for Wednesday or Thursday. He should avoid foods rich in potassium until his level is rechecked

## 2018-05-02 NOTE — Telephone Encounter (Signed)
Patient notified of lab results & recommendations. States that he is currently out of town for work. Should be back Friday or Saturday depending. Will call back to make a lab appointment.

## 2019-06-06 IMAGING — CT CT ANGIO CHEST
2 of 6 series · 19 of 36 positions shown · IV contrast (iopamidol)
Comparison: 04/10/2017 chest radiograph

CLINICAL DATA: 62 y/o M; PE suspected, intermediate prob, positive
D-dimer.

EXAM:
CT ANGIOGRAPHY CHEST WITH CONTRAST
TECHNIQUE: Multidetector CT imaging of the chest was performed using the
standard protocol during bolus administration of intravenous
contrast. Multiplanar CT image reconstructions and MIPs were
obtained to evaluate the vascular anatomy.
CONTRAST:  75mL [id number] IOPAMIDOL ([id number]) INJECTION 76%

[Series 7: pe thins · axial · 0.78mm/px · z∈[+1061,+1349]mm · 18 of 458 slices shown]
[im 23/458  lung]
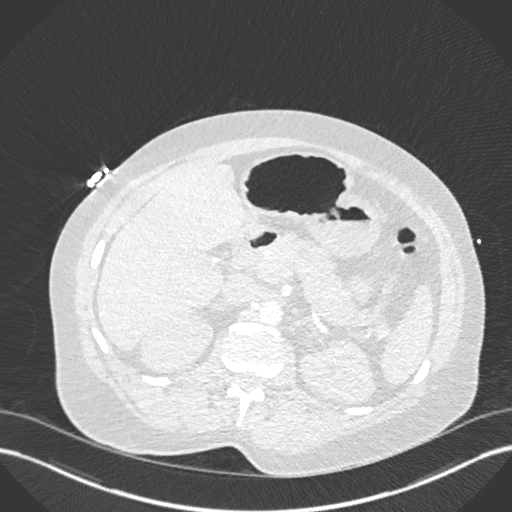
[im 46/458  mediastinal]
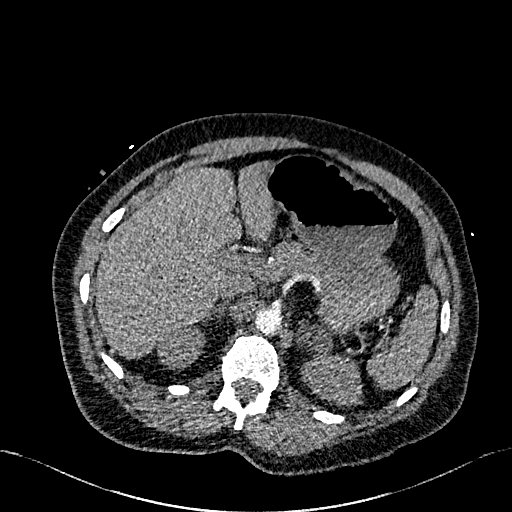
[im 69/458  lung]
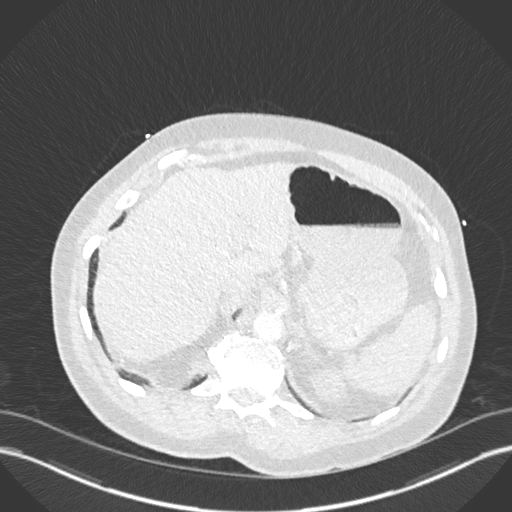
[im 92/458  mediastinal]
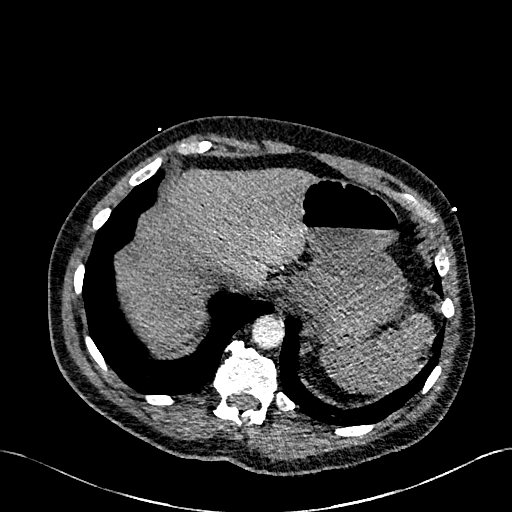
[im 115/458  lung]
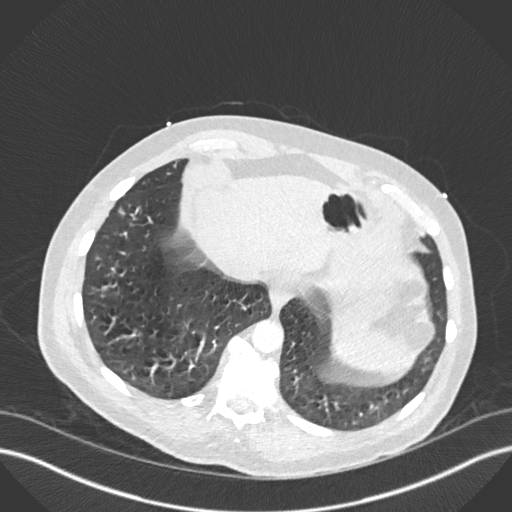
[im 138/458  mediastinal]
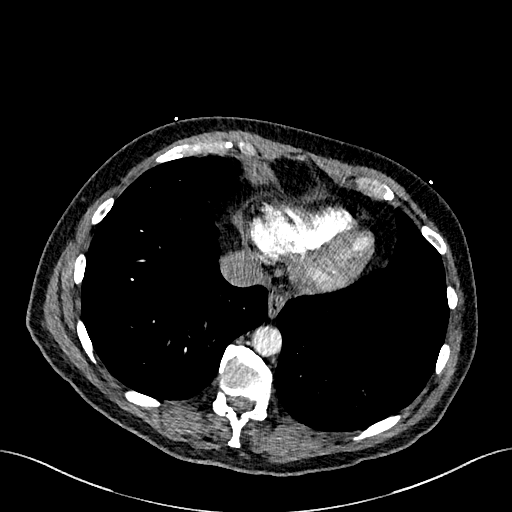
[im 160/458  lung]
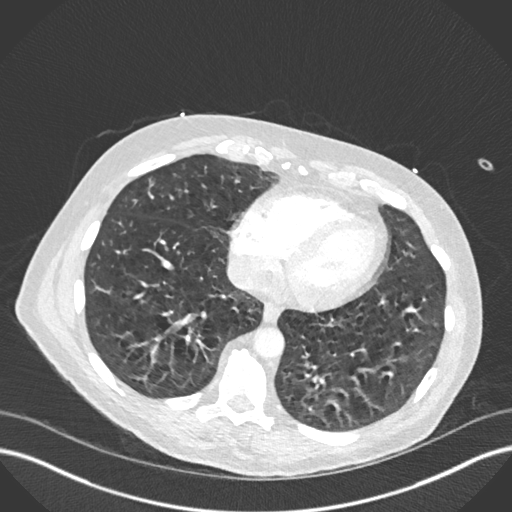
[im 183/458  mediastinal]
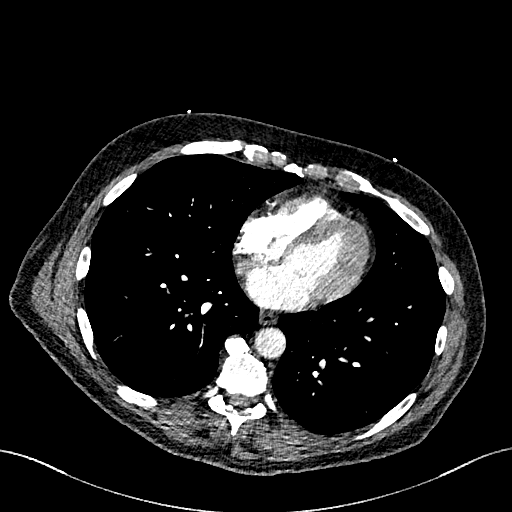
[im 206/458  lung]
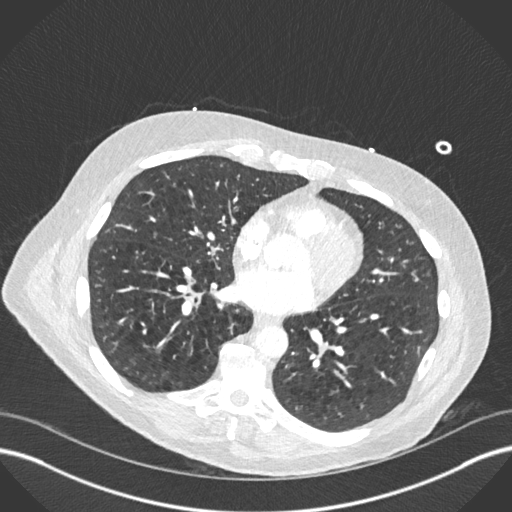
[im 252/458  mediastinal]
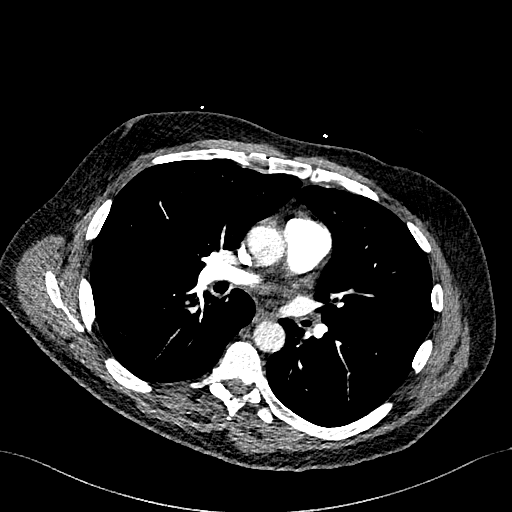
[im 275/458  lung]
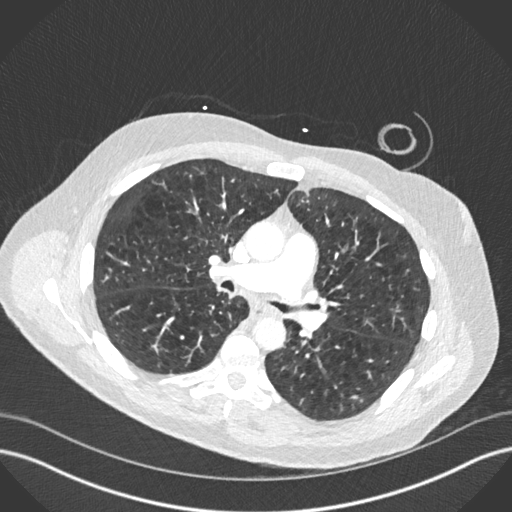
[im 298/458  mediastinal]
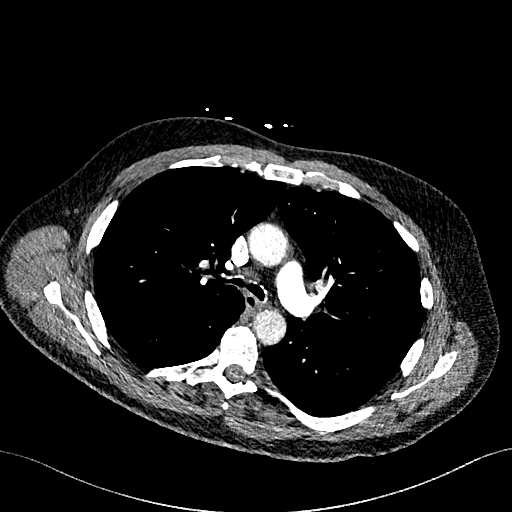
[im 320/458  lung]
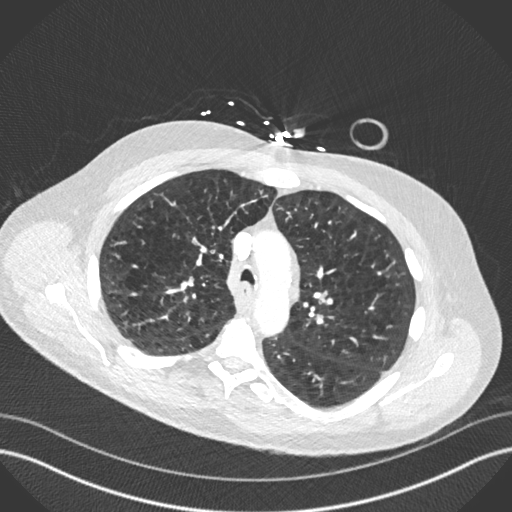
[im 343/458  mediastinal]
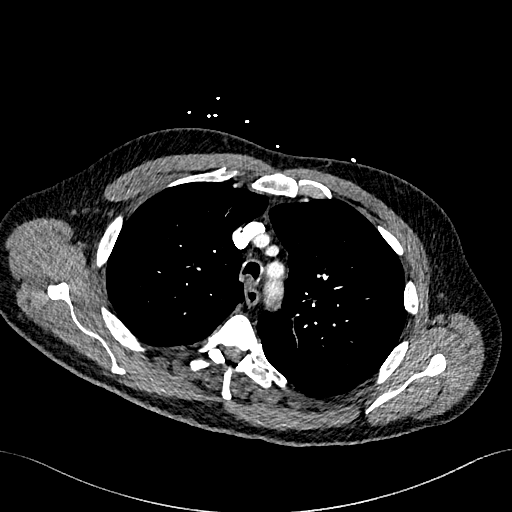
[im 366/458  lung]
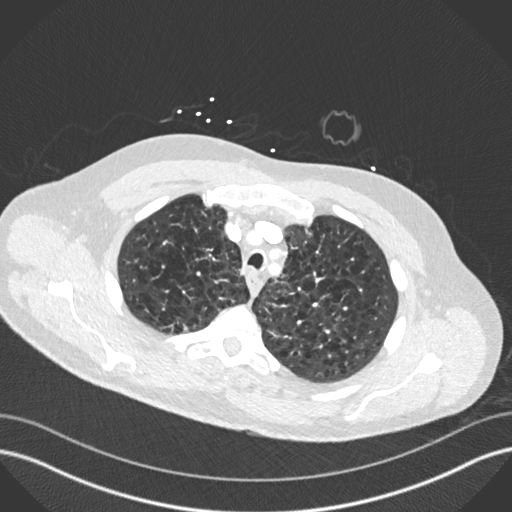
[im 389/458  mediastinal]
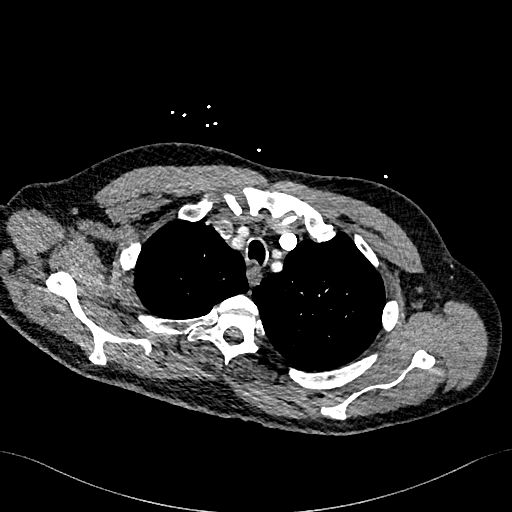
[im 412/458  lung]
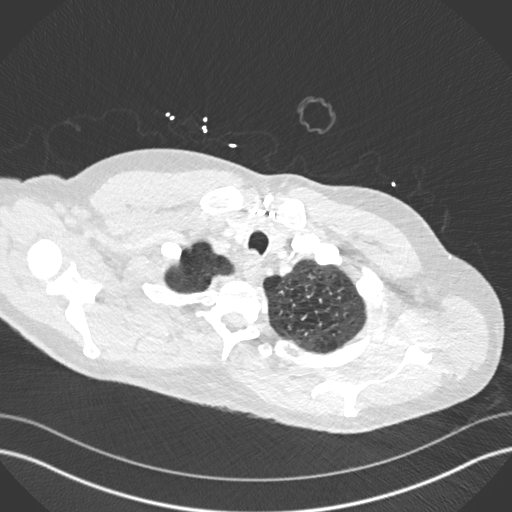
[im 435/458  mediastinal]
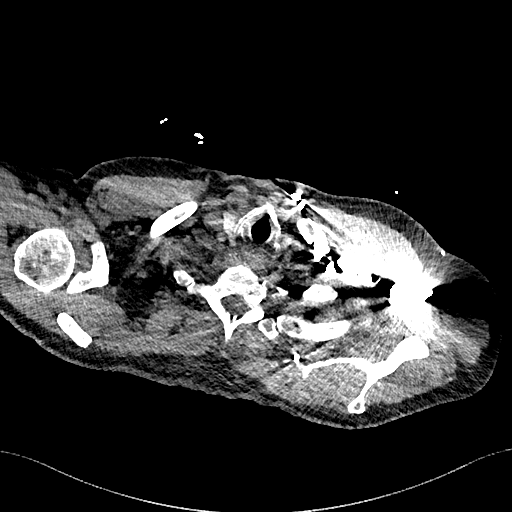

[Series 8: pe 2mm cor · coronal · 0.63mm/px · 1 of 127 slices shown]
[im 64/127  mediastinal]
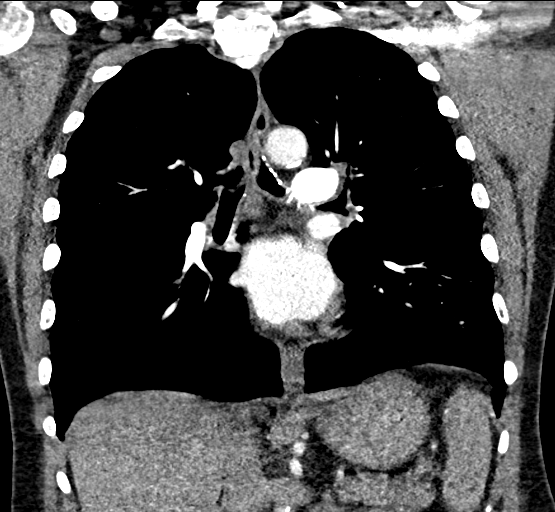

[19 of 36 positions shown; findings below may reference images not displayed]

FINDINGS: Cardiovascular: Satisfactory opacification of the pulmonary arteries
to the segmental level. No evidence of pulmonary embolism. Normal
heart size. No pericardial effusion. Mild aortic and moderate
coronary artery calcific atherosclerosis.

Mediastinum/Nodes: No enlarged mediastinal, hilar, or axillary lymph
nodes. Thyroid gland, trachea, and esophagus demonstrate no
significant findings.

Lungs/Pleura: Peribronchial thickening and scattered mucous
plugging. Scattered clusters of tiny nodules in bronchovascular
distribution. No consolidation, effusion, or pneumothorax. Moderate
centrilobular emphysema.

Upper Abdomen: 20 mm left adrenal nodule measuring 2 HU compatible
with adenoma.

Musculoskeletal: No chest wall abnormality. No acute or significant
osseous findings.

Review of the MIP images confirms the above findings.
IMPRESSION: 1. No pulmonary embolus identified.
2. Peribronchial thickening and scattered clustered nodules in
bronchovascular distribution compatible with mild
bronchitis/bronchiolitis. No consolidation.
3. Aortic Atherosclerosis ([id number]-GGA.A) and Emphysema ([id number].1).
4. Moderate coronary artery calcific atherosclerosis.
5. Left adrenal adenoma.
